# Patient Record
Sex: Female | Born: 1975 | Race: Black or African American | Hispanic: No | Marital: Single | Smoking: Never smoker
Health system: Southern US, Community
[De-identification: ages and names within clinical notes are randomized; demographics above are authoritative.]

## PROBLEM LIST (undated history)

## (undated) DIAGNOSIS — G35 Multiple sclerosis: Secondary | ICD-10-CM

## (undated) HISTORY — DX: Multiple sclerosis: G35

---
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
## 2001-02-17 ENCOUNTER — Other Ambulatory Visit: Admission: RE | Admit: 2001-02-17 | Discharge: 2001-02-17 | Payer: Self-pay | Admitting: *Deleted

## 2001-06-05 ENCOUNTER — Encounter (HOSPITAL_COMMUNITY): Admission: RE | Admit: 2001-06-05 | Discharge: 2001-07-05 | Payer: Self-pay | Admitting: Family Medicine

## 2002-01-20 ENCOUNTER — Emergency Department (HOSPITAL_COMMUNITY): Admission: EM | Admit: 2002-01-20 | Discharge: 2002-01-20 | Payer: Self-pay | Admitting: Emergency Medicine

## 2002-02-28 ENCOUNTER — Ambulatory Visit (HOSPITAL_COMMUNITY): Admission: RE | Admit: 2002-02-28 | Discharge: 2002-02-28 | Payer: Self-pay | Admitting: Psychiatry

## 2002-02-28 ENCOUNTER — Encounter: Payer: Self-pay | Admitting: Psychiatry

## 2002-04-06 ENCOUNTER — Emergency Department (HOSPITAL_COMMUNITY): Admission: EM | Admit: 2002-04-06 | Discharge: 2002-04-06 | Payer: Self-pay | Admitting: Internal Medicine

## 2002-04-07 ENCOUNTER — Encounter: Admission: RE | Admit: 2002-04-07 | Discharge: 2002-04-07 | Payer: Self-pay | Admitting: Family Medicine

## 2002-04-07 ENCOUNTER — Encounter (HOSPITAL_COMMUNITY): Admission: RE | Admit: 2002-04-07 | Discharge: 2002-05-07 | Payer: Self-pay | Admitting: Family Medicine

## 2003-01-29 ENCOUNTER — Encounter (HOSPITAL_COMMUNITY): Admission: RE | Admit: 2003-01-29 | Discharge: 2003-02-28 | Payer: Self-pay | Admitting: Family Medicine

## 2003-03-28 ENCOUNTER — Encounter: Payer: Self-pay | Admitting: Psychiatry

## 2003-03-28 ENCOUNTER — Ambulatory Visit (HOSPITAL_COMMUNITY): Admission: RE | Admit: 2003-03-28 | Discharge: 2003-03-28 | Payer: Self-pay | Admitting: Psychiatry

## 2009-01-28 DIAGNOSIS — R5383 Other fatigue: Secondary | ICD-10-CM | POA: Insufficient documentation

## 2012-06-09 DIAGNOSIS — L254 Unspecified contact dermatitis due to food in contact with skin: Secondary | ICD-10-CM | POA: Diagnosis not present

## 2013-01-17 ENCOUNTER — Encounter: Payer: Self-pay | Admitting: *Deleted

## 2013-02-13 DIAGNOSIS — H4720 Unspecified optic atrophy: Secondary | ICD-10-CM | POA: Diagnosis not present

## 2013-02-13 DIAGNOSIS — H52229 Regular astigmatism, unspecified eye: Secondary | ICD-10-CM | POA: Diagnosis not present

## 2013-02-13 DIAGNOSIS — G35 Multiple sclerosis: Secondary | ICD-10-CM | POA: Diagnosis not present

## 2013-02-13 DIAGNOSIS — H5231 Anisometropia: Secondary | ICD-10-CM | POA: Diagnosis not present

## 2013-07-19 ENCOUNTER — Ambulatory Visit (INDEPENDENT_AMBULATORY_CARE_PROVIDER_SITE_OTHER): Payer: Medicare Other | Admitting: Nurse Practitioner

## 2013-07-19 ENCOUNTER — Encounter: Payer: Self-pay | Admitting: Nurse Practitioner

## 2013-07-19 VITALS — BP 114/80 | Ht 67.0 in | Wt 167.1 lb

## 2013-07-19 DIAGNOSIS — M545 Low back pain: Secondary | ICD-10-CM

## 2013-07-19 DIAGNOSIS — G4700 Insomnia, unspecified: Secondary | ICD-10-CM | POA: Diagnosis not present

## 2013-07-19 DIAGNOSIS — G35 Multiple sclerosis: Secondary | ICD-10-CM

## 2013-07-19 DIAGNOSIS — K219 Gastro-esophageal reflux disease without esophagitis: Secondary | ICD-10-CM

## 2013-07-19 MED ORDER — ALPRAZOLAM 0.5 MG PO TABS: ORAL_TABLET | ORAL | Status: DC

## 2013-07-19 MED ORDER — NAPROXEN 500 MG PO TABS: 500.0000 mg | ORAL_TABLET | Freq: Two times a day (BID) | ORAL | Status: DC

## 2013-07-19 MED ORDER — HYDROCODONE-ACETAMINOPHEN 5-325 MG PO TABS: 1.0000 | ORAL_TABLET | Freq: Four times a day (QID) | ORAL | Status: DC | PRN

## 2013-07-19 MED ORDER — PANTOPRAZOLE SODIUM 40 MG PO TBEC: 40.0000 mg | DELAYED_RELEASE_TABLET | Freq: Every day | ORAL | Status: DC

## 2013-07-19 NOTE — Assessment & Plan Note (Signed)
Switched to [employer], insurance no longer covering Nexium. Symptoms are stable.

## 2013-07-19 NOTE — Patient Instructions (Signed)
If insurance will not cover TENS unit, recommend [facility]

## 2013-07-19 NOTE — Progress Notes (Signed)
Subjective:  Presents for followup. Is seeing a specialist at [facility] for her MS. Complaints of persistent low back pain for over a year after slipping in some water and landing on her low back area. Trouble sleeping due to pain. Tries to avoid taking regular pain medicine if possible. Can only sleep in certain positions with a pillow under her back. Worse with bending prolonged sitting or prolonged standing. Localized to the low back area. Slight relief with heat applications. Also slight relief with anti-inflammatory OTC meds.  Objective:   BP 114/80  Ht 5\' 7"  (1.702 m)  Wt 167 lb 2 oz (75.807 kg)  BMI 26.17 kg/m2 NAD. Alert, oriented. Lungs clear. Heart regular rhythm. Lumbar spinal tenderness noted with bilateral lumbar paraspinal tenderness. SLR negative bilaterally difficult exam due to MS. Abdomen soft nondistended nontender.  Assessment:Low back pain - Plan: DG Lumbar Spine Complete  GERD (gastroesophageal reflux disease)  Insomnia  Plan: Meds ordered this encounter  Medications  . ALPRAZolam (XANAX) 0.5 MG tablet    Sig: 1/2 - One BID  As needed    Dispense:  30 tablet    Refill:  0    Order Specific Question:  Supervising Provider    Answer:  Merlyn Albert [[id number]]  . HYDROcodone-acetaminophen (NORCO/VICODIN) 5-325 MG per tablet    Sig: Take 1 tablet by mouth every 6 (six) hours as needed for pain.    Dispense:  30 tablet    Refill:  2    Order Specific Question:  Supervising Provider    Answer:  Merlyn Albert [[id number]]  . naproxen (NAPROSYN) 500 MG tablet    Sig: Take 1 tablet (500 mg total) by mouth 2 (two) times daily with a meal. Prn pain    Dispense:  30 tablet    Refill:  2    Order Specific Question:  Supervising Provider    Answer:  Merlyn Albert [[id number]]   switch to Protonix 40 mg daily since Nexium is no longer covered by her insurance. Given prescription for TENS unit to see if her insurance will pay for this. Ice/heat to the back area with regular  stretching exercises. Recheck if persists. Otherwise routine followup.

## 2013-08-02 DIAGNOSIS — G35 Multiple sclerosis: Secondary | ICD-10-CM | POA: Diagnosis not present

## 2013-08-16 ENCOUNTER — Other Ambulatory Visit: Payer: Self-pay | Admitting: Nurse Practitioner

## 2013-08-16 ENCOUNTER — Other Ambulatory Visit: Payer: Self-pay | Admitting: *Deleted

## 2013-08-16 ENCOUNTER — Telehealth: Payer: Self-pay | Admitting: Nurse Practitioner

## 2013-08-16 MED ORDER — ALPRAZOLAM 0.5 MG PO TABS: ORAL_TABLET | ORAL | Status: DC

## 2013-08-16 MED ORDER — HYDROCODONE-ACETAMINOPHEN 5-325 MG PO TABS: 1.0000 | ORAL_TABLET | Freq: Four times a day (QID) | ORAL | Status: DC | PRN

## 2013-08-16 NOTE — Telephone Encounter (Signed)
Rx printed

## 2013-08-16 NOTE — Telephone Encounter (Addendum)
Patient notified and rx left up front for patient pick up

## 2013-08-16 NOTE — Telephone Encounter (Signed)
May have 3 refills 

## 2013-08-16 NOTE — Telephone Encounter (Signed)
Patient needs Rx for hydrocodone. °

## 2013-08-21 NOTE — Telephone Encounter (Signed)
Nurses please approve 4 refills

## 2013-09-24 ENCOUNTER — Other Ambulatory Visit: Payer: Self-pay | Admitting: Nurse Practitioner

## 2013-10-02 DIAGNOSIS — G35 Multiple sclerosis: Secondary | ICD-10-CM | POA: Diagnosis not present

## 2013-10-19 ENCOUNTER — Ambulatory Visit (INDEPENDENT_AMBULATORY_CARE_PROVIDER_SITE_OTHER): Payer: Medicare Other | Admitting: Nurse Practitioner

## 2013-10-19 ENCOUNTER — Encounter: Payer: Self-pay | Admitting: Nurse Practitioner

## 2013-10-19 VITALS — BP 118/78 | Ht 67.0 in | Wt 171.6 lb

## 2013-10-19 DIAGNOSIS — F411 Generalized anxiety disorder: Secondary | ICD-10-CM | POA: Diagnosis not present

## 2013-10-19 DIAGNOSIS — G8929 Other chronic pain: Secondary | ICD-10-CM | POA: Diagnosis not present

## 2013-10-19 DIAGNOSIS — K219 Gastro-esophageal reflux disease without esophagitis: Secondary | ICD-10-CM | POA: Diagnosis not present

## 2013-10-19 DIAGNOSIS — M549 Dorsalgia, unspecified: Secondary | ICD-10-CM | POA: Diagnosis not present

## 2013-10-19 DIAGNOSIS — Z23 Encounter for immunization: Secondary | ICD-10-CM | POA: Diagnosis not present

## 2013-10-19 MED ORDER — HYDROCODONE-ACETAMINOPHEN 5-325 MG PO TABS: ORAL_TABLET | ORAL | Status: DC

## 2013-10-19 MED ORDER — ALPRAZOLAM 0.5 MG PO TABS: ORAL_TABLET | ORAL | Status: DC

## 2013-10-19 MED ORDER — PANTOPRAZOLE SODIUM 40 MG PO TBEC: 40.0000 mg | DELAYED_RELEASE_TABLET | Freq: Every day | ORAL | Status: DC

## 2013-10-19 MED ORDER — CITALOPRAM HYDROBROMIDE 20 MG PO TABS: 20.0000 mg | ORAL_TABLET | Freq: Every day | ORAL | Status: DC

## 2013-10-19 MED ORDER — NAPROXEN 500 MG PO TABS: ORAL_TABLET | ORAL | Status: DC

## 2013-10-20 ENCOUNTER — Encounter: Payer: Self-pay | Admitting: Nurse Practitioner

## 2013-10-20 DIAGNOSIS — G8929 Other chronic pain: Secondary | ICD-10-CM | POA: Insufficient documentation

## 2013-10-20 DIAGNOSIS — F419 Anxiety disorder, unspecified: Secondary | ICD-10-CM | POA: Insufficient documentation

## 2013-10-20 NOTE — Assessment & Plan Note (Signed)
Continue hydrocodone and naproxen as directed when necessary.

## 2013-10-20 NOTE — Assessment & Plan Note (Signed)
Start Celexa as directed. Continue Xanax as directed. Recheck in one month.

## 2013-10-20 NOTE — Progress Notes (Signed)
Subjective:  Presents for routine followup. Continues to have chronic back pain. Getting significant relief with naproxen. Also using her TENS unit. Pain 7/10, hydrocodone totally relieves pain. Takes one pill a day, occasionally will take a second pill during the day when pain is intense. Reflux stable on Protonix. Has noticed more emotional lability and anxiety lately. Usually limits herself to one tablet of Xanax per day. Mainly for sleep at night. Denies suicidal thoughts or ideation.  Objective:   BP 118/78  Ht 5\' 7"  (1.702 m)  Wt 171 lb 9.6 oz (77.837 kg)  BMI 26.87 kg/m2 NAD. Alert, oriented. Cheerful affect. Lungs clear. Heart regular rate rhythm. Abdomen soft nontender.  Assessment:Chronic back pain  GERD (gastroesophageal reflux disease)  Anxiety state, unspecified  Plan: Meds ordered this encounter  Medications  . citalopram (CELEXA) 20 MG tablet    Sig: Take 1 tablet (20 mg total) by mouth daily. At bedtime    Dispense:  30 tablet    Refill:  0    Order Specific Question:  Supervising Provider    Answer:  Merlyn Albert [[id number]]  . ALPRAZolam (XANAX) 0.5 MG tablet    Sig: TAKE 1/2 TO 1 TABLET TWICE DAILY AS NEEDED    Dispense:  30 tablet    Refill:  2    Order Specific Question:  Supervising Provider    Answer:  Merlyn Albert [[id number]]  . DISCONTD: HYDROcodone-acetaminophen (NORCO/VICODIN) 5-325 MG per tablet    Sig: 1/2-1 po BID prn pain    Dispense:  45 tablet    Refill:  0    Order Specific Question:  Supervising Provider    Answer:  Merlyn Albert [[id number]]  . DISCONTD: HYDROcodone-acetaminophen (NORCO/VICODIN) 5-325 MG per tablet    Sig: 1/2-1 po BID prn pain    Dispense:  45 tablet    Refill:  0    May fill 30 days after 10/19/13    Order Specific Question:  Supervising Provider    Answer:  Merlyn Albert [[id number]]  . HYDROcodone-acetaminophen (NORCO/VICODIN) 5-325 MG per tablet    Sig: 1/2-1 po BID prn pain    Dispense:  45 tablet    Refill:  0   May fill 60 days after 10/19/13    Order Specific Question:  Supervising Provider    Answer:  Merlyn Albert [[id number]]  . naproxen (NAPROSYN) 500 MG tablet    Sig: TAKE 1 TABLET BY MOUTH TWO TIMES DAILY WITH A MEAL AS NEEDED FOR PAIN    Dispense:  60 tablet    Refill:  2    Order Specific Question:  Supervising Provider    Answer:  Merlyn Albert [[id number]]  . pantoprazole (PROTONIX) 40 MG tablet    Sig: Take 1 tablet (40 mg total) by mouth daily. For acid reflux    Dispense:  30 tablet    Refill:  5    Order Specific Question:  Supervising Provider    Answer:  Merlyn Albert [[id number]]   Continue ice/heat applications. TENS unit. Stretching exercises. Add Celexa to her regimen. Reviewed potential adverse effects. Stop medication and call if any problems. Recheck in one month, call back sooner if any problems. Reminded about preventive health physical.

## 2013-10-20 NOTE — Assessment & Plan Note (Signed)
Continue Protonix as directed.

## 2013-11-15 ENCOUNTER — Other Ambulatory Visit: Payer: Self-pay | Admitting: Nurse Practitioner

## 2013-11-20 ENCOUNTER — Encounter: Payer: Self-pay | Admitting: Nurse Practitioner

## 2013-11-20 ENCOUNTER — Ambulatory Visit (INDEPENDENT_AMBULATORY_CARE_PROVIDER_SITE_OTHER): Payer: Medicare Other | Admitting: Nurse Practitioner

## 2013-11-20 VITALS — BP 132/90 | Ht 67.5 in | Wt 171.8 lb

## 2013-11-20 DIAGNOSIS — F411 Generalized anxiety disorder: Secondary | ICD-10-CM | POA: Diagnosis not present

## 2013-11-20 MED ORDER — CITALOPRAM HYDROBROMIDE 20 MG PO TABS: ORAL_TABLET | ORAL | Status: DC

## 2013-11-24 ENCOUNTER — Encounter: Payer: Self-pay | Admitting: Nurse Practitioner

## 2013-11-24 NOTE — Assessment & Plan Note (Signed)
.   citalopram (CELEXA) 20 MG tablet    Sig: TAKE 1 TABLET AT BEDTIME    Dispense:  30 tablet    Refill:  5    Order Specific Question:  Supervising Provider    Answer:  Merlyn AlbertLUKING, WILLIAM S [[id number]]   Routine followup as planned. Call back if any problems.

## 2013-11-24 NOTE — Progress Notes (Signed)
Subjective:  Presents for routine followup. See previous note. Sleeping well. Emotional lability symptoms are much improved. Denies any adverse affects from Celexa.  Objective:   BP 132/90  Ht 5' 7.5" (1.715 m)  Wt 171 lb 12.8 oz (77.928 kg)  BMI 26.50 kg/m2 NAD. Alert, oriented. Cheerful affect. Lungs clear. Heart regular rate rhythm.  Assessment:Anxiety state, unspecified  status improved  Plan: Meds ordered this encounter  Medications  . citalopram (CELEXA) 20 MG tablet    Sig: TAKE 1 TABLET AT BEDTIME    Dispense:  30 tablet    Refill:  5    Order Specific Question:  Supervising Provider    Answer:  Merlyn Albert [[id number]]   Routine followup as planned. Call back if any problems.

## 2014-01-19 ENCOUNTER — Ambulatory Visit: Payer: Medicare Other | Admitting: Nurse Practitioner

## 2014-01-29 ENCOUNTER — Encounter: Payer: Self-pay | Admitting: Nurse Practitioner

## 2014-01-29 ENCOUNTER — Ambulatory Visit (INDEPENDENT_AMBULATORY_CARE_PROVIDER_SITE_OTHER): Payer: Medicare Other | Admitting: Nurse Practitioner

## 2014-01-29 VITALS — BP 120/80 | Ht 67.5 in | Wt 168.1 lb

## 2014-01-29 DIAGNOSIS — G8929 Other chronic pain: Secondary | ICD-10-CM | POA: Diagnosis not present

## 2014-01-29 DIAGNOSIS — F411 Generalized anxiety disorder: Secondary | ICD-10-CM | POA: Diagnosis not present

## 2014-01-29 DIAGNOSIS — M549 Dorsalgia, unspecified: Secondary | ICD-10-CM

## 2014-01-29 DIAGNOSIS — R350 Frequency of micturition: Secondary | ICD-10-CM

## 2014-01-29 DIAGNOSIS — K219 Gastro-esophageal reflux disease without esophagitis: Secondary | ICD-10-CM

## 2014-01-29 LAB — POCT URINALYSIS DIPSTICK
PH UA: 5
Spec Grav, UA: 1.02

## 2014-01-29 MED ORDER — ALPRAZOLAM 0.5 MG PO TABS: ORAL_TABLET | ORAL | Status: DC

## 2014-02-01 ENCOUNTER — Encounter: Payer: Self-pay | Admitting: Nurse Practitioner

## 2014-02-01 LAB — POCT UA - MICROSCOPIC ONLY
BACTERIA, U MICROSCOPIC: 0
MUCUS UA: 0
RBC, urine, microscopic: 0
WBC, UR, HPF, POC: 0

## 2014-02-01 NOTE — Progress Notes (Signed)
Subjective:  Presents for routine followup. Reflux stable on Protonix. Uses hydrocodone once a day for chronic back pain, occasionally will take a half to one tab during the day if needed. Doing well on Celexa, continues to have some breakthrough anxiety symptoms, using Xanax sparingly. Complaints of urinary frequency for the past several months. No dysuria. No pelvic pain. No vaginal discharge. Married, same sexual partner. No abdominal pain. No flank or mid back pain. Her chronic back pain is mainly in the low back area. No numbness or weakness of the legs. Bowels normal limit.  Objective:   BP 120/80  Ht 5' 7.5" (1.715 m)  Wt 168 lb 2 oz (76.261 kg)  BMI 25.93 kg/m2 NAD. Alert, oriented. Lungs clear. Heart regular rate rhythm. No CVA or flank tenderness. Mild tenderness in the lumbar paraspinal area. Reflexes normal limit lower extremities. Gait normal limit. Abdomen soft nondistended nontender. Results for orders placed in visit on 01/29/14  POCT URINALYSIS DIPSTICK      Result Value Ref Range   Color, UA       Clarity, UA       Glucose, UA       Bilirubin, UA       Ketones, UA       Spec Grav, UA 1.020     Blood, UA       pH, UA 5.0     Protein, UA       Urobilinogen, UA       Nitrite, UA       Leukocytes, UA small (1+)     urine microscopic negative.  Assessment: Problem List Items Addressed This Visit     Digestive   GERD (gastroesophageal reflux disease) (Chronic)     Other   Chronic back pain   Anxiety state, unspecified - Primary   Relevant Medications      ALPRAZolam (XANAX) tablet    Other Visit Diagnoses   Urinary frequency        Relevant Orders       POCT urinalysis dipstick (Completed)      Plan: Meds ordered this encounter  Medications  . ALPRAZolam (XANAX) 0.5 MG tablet    Sig: TAKE 1/2 TO 1 TABLET TWICE DAILY AS NEEDED    Dispense:  30 tablet    Refill:  2    Order Specific Question:  Supervising Provider    Answer:  Merlyn Albert [[id number]]   continue current medications as directed. Recommend ice/heat applications to the low back area. OTC TENS unit. Stretching exercises. Reminded about preventive health physical. Return in about 3 months (around 05/01/2014).

## 2014-02-05 ENCOUNTER — Telehealth: Payer: Self-pay | Admitting: Family Medicine

## 2014-02-05 MED ORDER — TRIAMCINOLONE ACETONIDE 0.1 % EX CREA: 1.0000 "application " | TOPICAL_CREAM | Freq: Two times a day (BID) | CUTANEOUS | Status: DC

## 2014-02-05 NOTE — Telephone Encounter (Signed)
Patient needs new prescription for itching triamcinolone acetonide cream called into [facility].

## 2014-02-05 NOTE — Addendum Note (Signed)
Addended byOneal Deputy D on: 02/05/2014 11:04 AM   Modules accepted: Orders

## 2014-02-05 NOTE — Telephone Encounter (Signed)
Medication sent patient notified. 

## 2014-02-05 NOTE — Telephone Encounter (Signed)
Ok 30 g .1% apply bid two ref

## 2014-02-16 ENCOUNTER — Encounter: Payer: Self-pay | Admitting: Nurse Practitioner

## 2014-02-16 ENCOUNTER — Ambulatory Visit (INDEPENDENT_AMBULATORY_CARE_PROVIDER_SITE_OTHER): Payer: Medicare Other | Admitting: Nurse Practitioner

## 2014-02-16 VITALS — BP 138/88 | Temp 98.4°F | Ht 67.5 in | Wt 167.0 lb

## 2014-02-16 DIAGNOSIS — B354 Tinea corporis: Secondary | ICD-10-CM | POA: Diagnosis not present

## 2014-02-16 MED ORDER — KETOCONAZOLE 2 % EX CREA: 1.0000 "application " | TOPICAL_CREAM | Freq: Two times a day (BID) | CUTANEOUS | Status: DC

## 2014-02-19 ENCOUNTER — Encounter: Payer: Self-pay | Admitting: Nurse Practitioner

## 2014-02-19 NOTE — Progress Notes (Signed)
Subjective:  Presents complaints of a rash on her lower abdomen mainly on the right side that is been there for a while. Used prescribed steroid cream with only slight improvement. Minimal itching, mainly a stinging sensation. No fever. No other rash. Blood sugars have been normal in the past, has not been checked lately.  Objective:   BP 138/88  Temp(Src) 98.4 F (36.9 C) (Oral)  Ht 5' 7.5" (1.715 m)  Wt 167 lb (75.751 kg)  BMI 25.75 kg/m2 Large oval-shaped moderately erythematous area with central clearing and well-defined border noted in the lower skin folds of the abdomen on the right side. No other rash noted.  Assessment:Tinea corporis   Plan: Meds ordered this encounter  Medications  . ketoconazole (NIZORAL) 2 % cream    Sig: Apply 1 application topically 2 (two) times daily.    Dispense:  30 g    Refill:  0    Order Specific Question:  Supervising Provider    Answer:  Merlyn AlbertLUKING, WILLIAM S [[id number]]   Use twice a day for 2 weeks. Call back if rash persists. Keep area clean and dry as possible. Strongly recommend preventive health physical.

## 2014-03-15 ENCOUNTER — Other Ambulatory Visit: Payer: Self-pay | Admitting: Nurse Practitioner

## 2014-05-01 ENCOUNTER — Ambulatory Visit: Payer: Medicare Other | Admitting: Nurse Practitioner

## 2014-06-08 ENCOUNTER — Encounter: Payer: Self-pay | Admitting: Nurse Practitioner

## 2014-06-08 ENCOUNTER — Ambulatory Visit (INDEPENDENT_AMBULATORY_CARE_PROVIDER_SITE_OTHER): Payer: Medicare Other | Admitting: Nurse Practitioner

## 2014-06-08 VITALS — BP 124/80 | Temp 98.8°F | Ht 67.5 in | Wt 170.0 lb

## 2014-06-08 DIAGNOSIS — E28319 Asymptomatic premature menopause: Secondary | ICD-10-CM

## 2014-06-08 DIAGNOSIS — N912 Amenorrhea, unspecified: Secondary | ICD-10-CM

## 2014-06-09 LAB — LUTEINIZING HORMONE: LH: 56.8 m[IU]/mL

## 2014-06-09 LAB — PROGESTERONE: Progesterone: 0.3 ng/mL

## 2014-06-09 LAB — FOLLICLE STIMULATING HORMONE: FSH: 101.6 m[IU]/mL

## 2014-06-12 ENCOUNTER — Other Ambulatory Visit: Payer: Self-pay | Admitting: Nurse Practitioner

## 2014-06-12 MED ORDER — ESTRADIOL 2 MG PO TABS: 2.0000 mg | ORAL_TABLET | Freq: Every day | ORAL | Status: DC

## 2014-06-12 MED ORDER — MEDROXYPROGESTERONE ACETATE 5 MG PO TABS: 5.0000 mg | ORAL_TABLET | Freq: Every day | ORAL | Status: DC

## 2014-06-15 ENCOUNTER — Encounter: Payer: Self-pay | Admitting: Nurse Practitioner

## 2014-06-15 DIAGNOSIS — E28319 Asymptomatic premature menopause: Secondary | ICD-10-CM | POA: Insufficient documentation

## 2014-06-15 DIAGNOSIS — Z79890 Hormone replacement therapy (postmenopausal): Secondary | ICD-10-CM

## 2014-06-15 LAB — ESTRADIOL, FREE
ESTRADIOL: 5 pg/mL
Estradiol, Free: 0.1 pg/mL

## 2014-06-15 NOTE — Progress Notes (Signed)
Subjective:  Presents to discuss her female hormones. Patient is thinking about a possible pregnancy. Has had an OB workup in [place]. Our last labs here a few years ago indicate premature menopause. Even if pregnancy is not a possibility especially due to cost, patient is interested in hormone therapy due to fatigue and decreased libido. Nonsmoker. No personal history of blood clots.  Objective:   BP 124/80  Temp(Src) 98.8 F (37.1 C) (Oral)  Ht 5' 7.5" (1.715 m)  Wt 170 lb (77.111 kg)  BMI 26.22 kg/m2 NAD. Alert, oriented. Lungs clear. Heart regular rate rhythm. Labs dated 01/16/2011 shows FSH 110.8 and LH 59.9.  Assessment: Anovulatory amenorrhea - Plan: Follicle stimulating hormone, Luteinizing hormone, Progesterone, Estradiol  Premature menopause  Plan: Lab work pending. Discussed starting HRT. Reviewed risks associated with HRT use. Defers referral for fertility testing or further workup. Explained that pregnancy is unlikely to happen if her numbers are still this high.

## 2014-07-27 ENCOUNTER — Telehealth: Payer: Self-pay | Admitting: Nurse Practitioner

## 2014-07-27 NOTE — Telephone Encounter (Signed)
Pt is having intermittent periods, was under the understanding that  She would not have any more since she was going through menopause  At this point. Wants to know if this is normal to have one once in a while Till they just stop all together?    She started the day after her bday, ran for four days. Then started again this week Ran for a few days    [facility]

## 2014-08-01 NOTE — Telephone Encounter (Signed)
Was this patient ever contacted?  °

## 2014-08-10 NOTE — Telephone Encounter (Signed)
I reviewed notes. Bleeding should have stopped based on labs. I recommend pelvic US for Abnormal Uterine Bleeding with follow up based on results. This will look at ovaries and uterus (fibroids and thickness of lining).

## 2014-08-13 NOTE — Telephone Encounter (Signed)
Left message to return call 

## 2014-08-13 NOTE — Telephone Encounter (Signed)
Patient said she had her last cycle years ago had off and on bleeding in Sept but no more problems in a week.

## 2014-08-22 NOTE — Telephone Encounter (Signed)
Still recommend pelvic US

## 2014-08-22 NOTE — Telephone Encounter (Signed)
Discussed with patient that carolyn recommends pelvic ultrasound.  Pt states she will call us back with the days that are good for her to go.

## 2014-09-21 ENCOUNTER — Telehealth: Payer: Self-pay | Admitting: Nurse Practitioner

## 2014-09-21 ENCOUNTER — Other Ambulatory Visit: Payer: Self-pay | Admitting: Nurse Practitioner

## 2014-09-21 MED ORDER — FLUOXETINE HCL 40 MG PO CAPS: 40.0000 mg | ORAL_CAPSULE | Freq: Every day | ORAL | Status: DC

## 2014-09-21 NOTE — Telephone Encounter (Signed)
Please clarify Celexa or Prozac?

## 2014-09-21 NOTE — Telephone Encounter (Signed)
Patient did not like the Celexa and wants to go back to Prozac

## 2014-09-21 NOTE — Telephone Encounter (Signed)
Patient needs Rx [facility]zac to [facility].

## 2014-09-22 DIAGNOSIS — Z23 Encounter for immunization: Secondary | ICD-10-CM | POA: Diagnosis not present

## 2015-04-11 ENCOUNTER — Encounter: Payer: Medicare Other | Admitting: Nurse Practitioner

## 2015-04-11 ENCOUNTER — Other Ambulatory Visit: Payer: Self-pay | Admitting: Nurse Practitioner

## 2015-05-07 ENCOUNTER — Ambulatory Visit (INDEPENDENT_AMBULATORY_CARE_PROVIDER_SITE_OTHER): Payer: Medicare Other | Admitting: Nurse Practitioner

## 2015-05-07 ENCOUNTER — Encounter: Payer: Self-pay | Admitting: Nurse Practitioner

## 2015-05-07 VITALS — BP 120/82

## 2015-05-07 DIAGNOSIS — M549 Dorsalgia, unspecified: Secondary | ICD-10-CM

## 2015-05-07 DIAGNOSIS — Z79899 Other long term (current) drug therapy: Secondary | ICD-10-CM

## 2015-05-07 DIAGNOSIS — E28319 Asymptomatic premature menopause: Secondary | ICD-10-CM | POA: Diagnosis not present

## 2015-05-07 DIAGNOSIS — G8929 Other chronic pain: Secondary | ICD-10-CM

## 2015-05-07 DIAGNOSIS — Z79890 Hormone replacement therapy (postmenopausal): Secondary | ICD-10-CM

## 2015-05-07 DIAGNOSIS — Z01419 Encounter for gynecological examination (general) (routine) without abnormal findings: Secondary | ICD-10-CM

## 2015-05-07 DIAGNOSIS — R5383 Other fatigue: Secondary | ICD-10-CM | POA: Diagnosis not present

## 2015-05-07 DIAGNOSIS — Z124 Encounter for screening for malignant neoplasm of cervix: Secondary | ICD-10-CM

## 2015-05-07 DIAGNOSIS — Z0000 Encounter for general adult medical examination without abnormal findings: Secondary | ICD-10-CM

## 2015-05-07 DIAGNOSIS — F419 Anxiety disorder, unspecified: Secondary | ICD-10-CM

## 2015-05-07 MED ORDER — TRAZODONE HCL 100 MG PO TABS: 100.0000 mg | ORAL_TABLET | Freq: Every day | ORAL | Status: DC

## 2015-05-07 MED ORDER — FLUOXETINE HCL 40 MG PO CAPS: 40.0000 mg | ORAL_CAPSULE | Freq: Every day | ORAL | Status: DC

## 2015-05-07 MED ORDER — HYDROCODONE-ACETAMINOPHEN 5-325 MG PO TABS: 1.0000 | ORAL_TABLET | ORAL | Status: DC | PRN

## 2015-05-07 NOTE — Patient Instructions (Signed)
Tdap or DT

## 2015-05-08 LAB — CBC WITH DIFFERENTIAL/PLATELET
BASOS: 1 %
Basophils Absolute: 0 10*3/uL (ref 0.0–0.2)
EOS (ABSOLUTE): 0.1 10*3/uL (ref 0.0–0.4)
Eos: 3 %
HEMATOCRIT: 34.6 % (ref 34.0–46.6)
Hemoglobin: 11 g/dL — ABNORMAL LOW (ref 11.1–15.9)
Immature Grans (Abs): 0 10*3/uL (ref 0.0–0.1)
Immature Granulocytes: 0 %
LYMPHS: 54 %
Lymphocytes Absolute: 2.3 10*3/uL (ref 0.7–3.1)
MCH: 26.6 pg (ref 26.6–33.0)
MCHC: 31.8 g/dL (ref 31.5–35.7)
MCV: 84 fL (ref 79–97)
Monocytes Absolute: 0.2 10*3/uL (ref 0.1–0.9)
Monocytes: 5 %
NEUTROS ABS: 1.6 10*3/uL (ref 1.4–7.0)
Neutrophils: 37 %
Platelets: 363 10*3/uL (ref 150–379)
RBC: 4.13 x10E6/uL (ref 3.77–5.28)
RDW: 13.9 % (ref 12.3–15.4)
WBC: 4.3 10*3/uL (ref 3.4–10.8)

## 2015-05-08 LAB — COMPREHENSIVE METABOLIC PANEL
ALK PHOS: 71 IU/L (ref 39–117)
ALT: 11 IU/L (ref 0–32)
AST: 14 IU/L (ref 0–40)
Albumin/Globulin Ratio: 1.3 (ref 1.1–2.5)
Albumin: 4.3 g/dL (ref 3.5–5.5)
BILIRUBIN TOTAL: 0.3 mg/dL (ref 0.0–1.2)
BUN / CREAT RATIO: 8 (ref 8–20)
BUN: 7 mg/dL (ref 6–20)
CO2: 22 mmol/L (ref 18–29)
Calcium: 9.7 mg/dL (ref 8.7–10.2)
Chloride: 103 mmol/L (ref 97–108)
Creatinine, Ser: 0.85 mg/dL (ref 0.57–1.00)
GFR calc non Af Amer: 87 mL/min/{1.73_m2} (ref >59)
GFR, EST AFRICAN AMERICAN: 100 mL/min/{1.73_m2} (ref >59)
Globulin, Total: 3.2 g/dL (ref 1.5–4.5)
Glucose: 99 mg/dL (ref 65–99)
POTASSIUM: 4.1 mmol/L (ref 3.5–5.2)
Sodium: 140 mmol/L (ref 134–144)
Total Protein: 7.5 g/dL (ref 6.0–8.5)

## 2015-05-08 LAB — LIPID PANEL
Chol/HDL Ratio: 4.3 ratio units (ref 0.0–4.4)
Cholesterol, Total: 204 mg/dL — ABNORMAL HIGH (ref 100–199)
HDL: 47 mg/dL (ref >39)
LDL Calculated: 136 mg/dL — ABNORMAL HIGH (ref 0–99)
TRIGLYCERIDES: 106 mg/dL (ref 0–149)
VLDL Cholesterol Cal: 21 mg/dL (ref 5–40)

## 2015-05-08 LAB — TSH: TSH: 3.18 u[IU]/mL (ref 0.450–4.500)

## 2015-05-08 LAB — HEPATIC FUNCTION PANEL: Bilirubin, Direct: 0.07 mg/dL (ref 0.00–0.40)

## 2015-05-10 ENCOUNTER — Encounter: Payer: Self-pay | Admitting: Nurse Practitioner

## 2015-05-10 LAB — PAP IG W/ RFLX HPV ASCU: PAP Smear Comment: 0

## 2015-05-10 NOTE — Progress Notes (Signed)
Subjective:    Patient ID: Stacey Kim, female    DOB: 1975/12/02, 39 y.o.   MRN: [id number]  HPI presents for her wellness exam. Same sexual partner. No vaginal bleeding or pelvic pain. Regular vision and dental exams. Anxiety and depression stable on current regimen. Regular exercise. Down a size in clothing. Overall healthy diet. Continues follow up with MS specialist. Uses rare hydrocodone for chronic back pain.     Review of Systems  Constitutional: Positive for fatigue. Negative for fever, activity change and appetite change.  HENT: Negative for dental problem, ear pain, sinus pressure and sore throat.   Respiratory: Negative for cough, chest tightness, shortness of breath and wheezing.   Cardiovascular: Negative for chest pain.  Gastrointestinal: Negative for nausea, vomiting, abdominal pain, diarrhea, constipation and abdominal distention.  Genitourinary: Negative for dysuria, urgency, frequency, vaginal bleeding, vaginal discharge, enuresis, difficulty urinating, genital sores and pelvic pain.  Musculoskeletal: Positive for back pain.       Objective:   Physical Exam  Constitutional: She is oriented to person, place, and time. She appears well-developed. No distress.  HENT:  Right Ear: External ear normal.  Left Ear: External ear normal.  Mouth/Throat: Oropharynx is clear and moist.  Neck: Normal range of motion. Neck supple. No tracheal deviation present. No thyromegaly present.  Cardiovascular: Normal rate, regular rhythm and normal heart sounds.  Exam reveals no gallop.   No murmur heard. Pulmonary/Chest: Effort normal and breath sounds normal.  Abdominal: Soft. She exhibits no distension. There is no tenderness.  Genitourinary: Vagina normal and uterus normal. No vaginal discharge found.  External GU: no rashes or lesions. Vagina: no discharge. Cervix normal in appearance; no CMT. Bimanual exam: no tenderness or obvious masses.  Musculoskeletal: She exhibits no  edema.  Lymphadenopathy:    She has no cervical adenopathy.  Neurological: She is alert and oriented to person, place, and time.  Skin: Skin is warm and dry. No rash noted.  Psychiatric: She has a normal mood and affect. Her behavior is normal.  Vitals reviewed. Breast exam: no masses; axillae no adenopathy.         Assessment & Plan:   Problem List Items Addressed This Visit      Other   Anxiety   Relevant Medications   FLUoxetine (PROZAC) 40 MG capsule   traZODone (DESYREL) 100 MG tablet   Chronic back pain   Relevant Medications   HYDROcodone-acetaminophen (NORCO/VICODIN) 5-325 MG per tablet   Premature menopause on hormone replacement therapy    Other Visit Diagnoses    Well woman exam    -  Primary    Relevant Orders    Pap IG w/ reflex to HPV when ASC-U    Lipid panel (Completed)    Screening for cervical cancer        Relevant Orders    Pap IG w/ reflex to HPV when ASC-U    High risk medication use        Relevant Orders    Hepatic function panel (Completed)    Comprehensive metabolic panel (Completed)    Other fatigue        Relevant Orders    CBC w/Diff (Completed)    TSH (Completed)      Meds ordered this encounter  Medications  . DISCONTD: traZODone (DESYREL) 100 MG tablet    Sig: Take 100 mg by mouth at bedtime.  Marland Kitchen DISCONTD: HYDROcodone-acetaminophen (VICODIN) 5-500 MG per tablet    Sig: Take 1 tablet by  mouth every 6 (six) hours as needed for pain.  Marland Kitchen FLUoxetine (PROZAC) 40 MG capsule    Sig: Take 1 capsule (40 mg total) by mouth daily.    Dispense:  30 capsule    Refill:  5    Order Specific Question:  Supervising Provider    Answer:  Merlyn Albert [[id number]]  . traZODone (DESYREL) 100 MG tablet    Sig: Take 1 tablet (100 mg total) by mouth at bedtime.    Dispense:  30 tablet    Refill:  5    Order Specific Question:  Supervising Provider    Answer:  Merlyn Albert [[id number]]  . HYDROcodone-acetaminophen (NORCO/VICODIN) 5-325 MG per tablet      Sig: Take 1 tablet by mouth every 4 (four) hours as needed.    Dispense:  30 tablet    Refill:  0    Order Specific Question:  Supervising Provider    Answer:  Merlyn Albert [[id number]]   Encouraged continued activity and weight loss. Daily vitamin D and calcium.  Return in about 6 months (around 11/06/2015) for recheck.

## 2015-07-04 ENCOUNTER — Other Ambulatory Visit: Payer: Self-pay | Admitting: Nurse Practitioner

## 2015-07-04 MED ORDER — HYDROCODONE-ACETAMINOPHEN 5-325 MG PO TABS: 1.0000 | ORAL_TABLET | ORAL | Status: DC | PRN

## 2015-07-04 MED ORDER — TRIAMCINOLONE ACETONIDE 0.1 % EX CREA: 1.0000 "application " | TOPICAL_CREAM | Freq: Two times a day (BID) | CUTANEOUS | Status: DC

## 2015-07-26 ENCOUNTER — Other Ambulatory Visit: Payer: Self-pay | Admitting: Nurse Practitioner

## 2015-08-29 DIAGNOSIS — Z23 Encounter for immunization: Secondary | ICD-10-CM | POA: Diagnosis not present

## 2015-09-30 ENCOUNTER — Telehealth: Payer: Self-pay | Admitting: Family Medicine

## 2015-09-30 MED ORDER — HYDROCODONE-ACETAMINOPHEN 5-325 MG PO TABS: 1.0000 | ORAL_TABLET | ORAL | Status: DC | PRN

## 2015-09-30 NOTE — Telephone Encounter (Signed)
She may have a prescription for 15 tablets but will need follow-up office visit winds she is needing additional prescription-explain to patient that this is a class II medicine she may not be aware of this

## 2015-09-30 NOTE — Telephone Encounter (Signed)
Rx up front for patient pick up. Patient notified and will schedule follow up office visit for further refills.

## 2015-09-30 NOTE — Telephone Encounter (Signed)
HYDROcodone-acetaminophen (NORCO/VICODIN) 5-325 MG per tablet  Pt needs refill please

## 2015-10-01 ENCOUNTER — Telehealth: Payer: Self-pay | Admitting: Nurse Practitioner

## 2015-10-01 NOTE — Telephone Encounter (Signed)
Left message to return call 

## 2015-10-01 NOTE — Telephone Encounter (Signed)
Appointment scheduled 10/21/15 with Eber Jones for pain management.

## 2015-10-01 NOTE — Telephone Encounter (Signed)
Pt is wanting Eber Jones to call her. Pt did not specify what the reason was.

## 2015-10-01 NOTE — Telephone Encounter (Signed)
Patient would like to schedule a follow up on pain management with Eber Jones. Patient transferred up front to schedule office visit.

## 2015-10-21 ENCOUNTER — Ambulatory Visit (INDEPENDENT_AMBULATORY_CARE_PROVIDER_SITE_OTHER): Payer: Medicare Other | Admitting: Nurse Practitioner

## 2015-10-21 ENCOUNTER — Other Ambulatory Visit: Payer: Self-pay | Admitting: Nurse Practitioner

## 2015-10-21 ENCOUNTER — Encounter: Payer: Self-pay | Admitting: Nurse Practitioner

## 2015-10-21 ENCOUNTER — Telehealth: Payer: Self-pay | Admitting: Nurse Practitioner

## 2015-10-21 VITALS — BP 114/76 | Ht 66.0 in | Wt 187.0 lb

## 2015-10-21 DIAGNOSIS — F419 Anxiety disorder, unspecified: Secondary | ICD-10-CM | POA: Diagnosis not present

## 2015-10-21 DIAGNOSIS — E28319 Asymptomatic premature menopause: Secondary | ICD-10-CM

## 2015-10-21 DIAGNOSIS — G8929 Other chronic pain: Secondary | ICD-10-CM

## 2015-10-21 DIAGNOSIS — Z79890 Hormone replacement therapy (postmenopausal): Secondary | ICD-10-CM

## 2015-10-21 DIAGNOSIS — K219 Gastro-esophageal reflux disease without esophagitis: Secondary | ICD-10-CM

## 2015-10-21 DIAGNOSIS — M549 Dorsalgia, unspecified: Secondary | ICD-10-CM | POA: Diagnosis not present

## 2015-10-21 MED ORDER — HYDROCODONE-ACETAMINOPHEN 5-325 MG PO TABS: 1.0000 | ORAL_TABLET | ORAL | Status: DC | PRN

## 2015-10-21 MED ORDER — PANTOPRAZOLE SODIUM 40 MG PO TBEC: 40.0000 mg | DELAYED_RELEASE_TABLET | Freq: Every day | ORAL | Status: DC

## 2015-10-21 MED ORDER — FLUOXETINE HCL 40 MG PO CAPS: 40.0000 mg | ORAL_CAPSULE | Freq: Every day | ORAL | Status: DC

## 2015-10-21 MED ORDER — NORETHINDRONE ACET-ETHINYL EST 1-20 MG-MCG PO TABS: 1.0000 | ORAL_TABLET | Freq: Every day | ORAL | Status: DC

## 2015-10-21 MED ORDER — NORETHIN-ETH ESTRAD-FE BIPHAS 1 MG-10 MCG / 10 MCG PO TABS: 1.0000 | ORAL_TABLET | Freq: Every day | ORAL | Status: DC

## 2015-10-21 MED ORDER — KETOCONAZOLE 2 % EX CREA: TOPICAL_CREAM | CUTANEOUS | Status: DC

## 2015-10-21 MED ORDER — NAPROXEN 500 MG PO TABS: ORAL_TABLET | ORAL | Status: DC

## 2015-10-21 NOTE — Telephone Encounter (Signed)
[facility] 10/21/15 

## 2015-10-21 NOTE — Telephone Encounter (Signed)
Will try another one. Let me know if too expensive.

## 2015-10-21 NOTE — Telephone Encounter (Signed)
Pt is unable to afford the medication that was called in. The medication will cost $120 and her ins doesn't cover it. Pt is needing something called in that her ins will cover or is cheaper. Please advise.

## 2015-10-21 NOTE — Telephone Encounter (Signed)
Called patient and informed her per Stacey Kim- low dose pill was sent in and pharmacy was asked not to fill it until she could see how much it will cost. Also wants patient to take it continuously, so we will probably need to get PA which will take a little time. Patient verbalized understanding.

## 2015-10-21 NOTE — Telephone Encounter (Signed)
Patient was seen this am. I sent in order for her low dose pill and asked them not to fill it until she could see how much it will cost. Also want her to take it continuously, so we will probably need to get PA which will take a little time.

## 2015-10-21 NOTE — Telephone Encounter (Signed)
Called patient and informed her per Nathaneil Canary- Will try another one. Let me know if too expensive. Patient verbalized understanding.

## 2015-10-22 ENCOUNTER — Other Ambulatory Visit: Payer: Self-pay | Admitting: *Deleted

## 2015-10-22 MED ORDER — FLUOXETINE HCL 40 MG PO CAPS: 40.0000 mg | ORAL_CAPSULE | Freq: Every day | ORAL | Status: DC

## 2015-10-23 ENCOUNTER — Encounter: Payer: Self-pay | Admitting: Nurse Practitioner

## 2015-10-23 MED ORDER — TRAZODONE HCL 100 MG PO TABS: 100.0000 mg | ORAL_TABLET | Freq: Every day | ORAL | Status: AC

## 2015-10-23 NOTE — Progress Notes (Signed)
Subjective:  Presents for routine follow-up. Continues to have amenorrhea. Significantly decreased sex to. Continues to have hot flashes. Stopped her hormone therapy. Increased generalized pain particularly chronic back pain during the wintertime. Increases her pain medication dose during that time. Uses on a when necessary basis. Reflux has been stable, takes Protonix as needed. Also takes naproxen with food as needed for pain which helps.  Objective:   BP 114/76 mmHg  Ht 5\' 6"  (1.676 m)  Wt 187 lb (84.823 kg)  BMI 30.20 kg/m2 NAD. Alert, oriented. Cheerful affect. Lungs clear. Heart regular rate rhythm. Abdomen soft nondistended nontender.  Assessment:  Problem List Items Addressed This Visit      Digestive   GERD (gastroesophageal reflux disease) - Primary (Chronic)   Relevant Medications   pantoprazole (PROTONIX) 40 MG tablet     Other   Anxiety   Chronic back pain   Relevant Medications   naproxen (NAPROSYN) 500 MG tablet   HYDROcodone-acetaminophen (NORCO/VICODIN) 5-325 MG tablet   Premature menopause on hormone replacement therapy     Plan:  Meds ordered this encounter  Medications  . DISCONTD: FLUoxetine (PROZAC) 40 MG capsule    Sig: Take 1 capsule (40 mg total) by mouth daily.    Dispense:  30 capsule    Refill:  5    Order Specific Question:  Supervising Provider    Answer:  Merlyn Albert [[id number]]  . DISCONTD: HYDROcodone-acetaminophen (NORCO/VICODIN) 5-325 MG tablet    Sig: Take 1 tablet by mouth every 4 (four) hours as needed.    Dispense:  30 tablet    Refill:  0    Order Specific Question:  Supervising Provider    Answer:  Merlyn Albert [[id number]]  . naproxen (NAPROSYN) 500 MG tablet    Sig: TAKE 1 TABLET TWICE A DAY WITH FOOD AS NEEDED FOR PAIN    Dispense:  60 tablet    Refill:  2    Order Specific Question:  Supervising Provider    Answer:  Merlyn Albert [[id number]]  . pantoprazole (PROTONIX) 40 MG tablet    Sig: Take 1 tablet (40 mg total) by mouth  daily. For acid reflux    Dispense:  30 tablet    Refill:  5    Order Specific Question:  Supervising Provider    Answer:  Merlyn Albert [[id number]]  . ketoconazole (NIZORAL) 2 % cream    Sig: APPLY TOPICALLY TWO TIMES DAILY PRN    Dispense:  30 g    Refill:  0    Order Specific Question:  Supervising Provider    Answer:  Merlyn Albert [[id number]]  . DISCONTD: HYDROcodone-acetaminophen (NORCO/VICODIN) 5-325 MG tablet    Sig: Take 1 tablet by mouth every 4 (four) hours as needed.    Dispense:  30 tablet    Refill:  0    May fill 30 days from 10/21/15    Order Specific Question:  Supervising Provider    Answer:  Merlyn Albert [[id number]]  . HYDROcodone-acetaminophen (NORCO/VICODIN) 5-325 MG tablet    Sig: Take 1 tablet by mouth every 4 (four) hours as needed.    Dispense:  30 tablet    Refill:  0    May fill 60 days from 10/21/15    Order Specific Question:  Supervising Provider    Answer:  Merlyn Albert [[id number]]  . DISCONTD: Norethindrone-Ethinyl Estradiol-Fe Biphas (LO LOESTRIN FE) 1 MG-10 MCG / 10 MCG tablet    Sig: Take 1  tablet by mouth daily.    Dispense:  1 Package    Refill:  11    Please do not fill until patient knows cost of med; also needs to take continuously so will probably need PA    Order Specific Question:  Supervising Provider    Answer:  Merlyn Albert [[id number]]   limit hydrocodone to 30 pills per month. Discussed options regarding hormones. Reviewed risk including blood clots and cancer risk. Start low-dose birth control pills continuously. Call back if any problems. Recommend regular activity as tolerated discussed importance of weight loss. Return in about 6 months (around 04/20/2016) for physical.

## 2015-12-11 ENCOUNTER — Ambulatory Visit (INDEPENDENT_AMBULATORY_CARE_PROVIDER_SITE_OTHER): Payer: Medicare Other | Admitting: Nurse Practitioner

## 2015-12-11 ENCOUNTER — Encounter: Payer: Self-pay | Admitting: Nurse Practitioner

## 2015-12-11 VITALS — BP 132/90 | Ht 66.0 in | Wt 188.0 lb

## 2015-12-11 DIAGNOSIS — L03115 Cellulitis of right lower limb: Secondary | ICD-10-CM

## 2015-12-11 DIAGNOSIS — N939 Abnormal uterine and vaginal bleeding, unspecified: Secondary | ICD-10-CM

## 2015-12-11 MED ORDER — ALPRAZOLAM 0.5 MG PO TBDP: 0.5000 mg | ORAL_TABLET | Freq: Two times a day (BID) | ORAL | Status: DC | PRN

## 2015-12-11 MED ORDER — DOXYCYCLINE HYCLATE 100 MG PO TABS: 100.0000 mg | ORAL_TABLET | Freq: Two times a day (BID) | ORAL | Status: DC

## 2015-12-11 MED ORDER — ALPRAZOLAM 0.5 MG PO TABS: 0.5000 mg | ORAL_TABLET | Freq: Two times a day (BID) | ORAL | Status: DC | PRN

## 2015-12-12 ENCOUNTER — Encounter: Payer: Self-pay | Admitting: Nurse Practitioner

## 2015-12-12 DIAGNOSIS — N939 Abnormal uterine and vaginal bleeding, unspecified: Secondary | ICD-10-CM | POA: Insufficient documentation

## 2015-12-12 NOTE — Progress Notes (Signed)
Subjective:   Presents to discuss her menstrual cycle. On  1/23 had her first cycle she's had a long time. Lasted about a week. Dark blood noted. Currently on daily birth control pills. Cycle occurred on time during inactive pills. No pelvic pain. Married, same sexual partner. Also has a sore area on the right outer leg for the past 2 weeks. Much improved. Husband has a similar area. Also requesting some Xanax to help her with claustrophobia for an upcoming MRI.  Objective:   BP 132/90 mmHg  Ht 5\' 6"  (1.676 m)  Wt 188 lb (85.276 kg)  BMI 30.36 kg/m2  NAD. Alert, oriented. Lungs clear. Heart regular rate rhythm. Approximately 1 cm slightly raised discolored area on the right outer leg slightly firm minimally tender.  Assessment:  Problem List Items Addressed This Visit      Genitourinary   Abnormal uterine bleeding - Primary   Relevant Orders   US Pelvis Complete   US Transvaginal Non-OB    Other Visit Diagnoses    Cellulitis of leg, right     resolving      Plan:  Meds ordered this encounter  Medications  . DISCONTD: ALPRAZolam (NIRAVAM) 0.5 MG dissolvable tablet    Sig: Take 1 tablet (0.5 mg total) by mouth 2 (two) times daily as needed for anxiety.    Dispense:  10 tablet    Refill:  0    Order Specific Question:  Supervising Provider    Answer:  Merlyn Albert [[id number]]  . doxycycline (VIBRA-TABS) 100 MG tablet    Sig: Take 1 tablet (100 mg total) by mouth 2 (two) times daily.    Dispense:  20 tablet    Refill:  0    Order Specific Question:  Supervising Provider    Answer:  Merlyn Albert [[id number]]  . ALPRAZolam (XANAX) 0.5 MG tablet    Sig: Take 1 tablet (0.5 mg total) by mouth 2 (two) times daily as needed for anxiety.    Dispense:  10 tablet    Refill:  0    Order Specific Question:  Supervising Provider    Answer:  Merlyn Albert [[id number]]    continue birth control pills as directed. Call back if any problems.

## 2015-12-20 ENCOUNTER — Ambulatory Visit (HOSPITAL_COMMUNITY): Payer: Medicare Other

## 2015-12-20 ENCOUNTER — Ambulatory Visit (HOSPITAL_COMMUNITY)
Admission: RE | Admit: 2015-12-20 | Discharge: 2015-12-20 | Disposition: A | Discharge status: Home / Self Care | Payer: Medicare Other | Source: Ambulatory Visit | Attending: Nurse Practitioner | Admitting: Nurse Practitioner

## 2015-12-20 DIAGNOSIS — N939 Abnormal uterine and vaginal bleeding, unspecified: Secondary | ICD-10-CM | POA: Diagnosis not present

## 2015-12-20 DIAGNOSIS — N938 Other specified abnormal uterine and vaginal bleeding: Secondary | ICD-10-CM | POA: Diagnosis not present

## 2016-08-21 ENCOUNTER — Ambulatory Visit (INDEPENDENT_AMBULATORY_CARE_PROVIDER_SITE_OTHER): Payer: Medicare Other | Admitting: Nurse Practitioner

## 2016-08-21 ENCOUNTER — Encounter: Payer: Self-pay | Admitting: Nurse Practitioner

## 2016-08-21 VITALS — BP 126/74 | Ht 66.0 in | Wt 164.2 lb

## 2016-08-21 DIAGNOSIS — K219 Gastro-esophageal reflux disease without esophagitis: Secondary | ICD-10-CM | POA: Diagnosis not present

## 2016-08-21 DIAGNOSIS — Z23 Encounter for immunization: Secondary | ICD-10-CM | POA: Diagnosis not present

## 2016-08-21 DIAGNOSIS — F419 Anxiety disorder, unspecified: Secondary | ICD-10-CM

## 2016-08-21 DIAGNOSIS — Z79891 Long term (current) use of opiate analgesic: Secondary | ICD-10-CM | POA: Diagnosis not present

## 2016-08-21 MED ORDER — HYDROCODONE-ACETAMINOPHEN 5-325 MG PO TABS: 1.0000 | ORAL_TABLET | ORAL | 0 refills | Status: DC | PRN

## 2016-08-21 MED ORDER — PANTOPRAZOLE SODIUM 40 MG PO TBEC: 40.0000 mg | DELAYED_RELEASE_TABLET | Freq: Every day | ORAL | 5 refills | Status: DC

## 2016-08-21 MED ORDER — ALPRAZOLAM 0.5 MG PO TABS: 0.5000 mg | ORAL_TABLET | Freq: Two times a day (BID) | ORAL | 0 refills | Status: DC | PRN

## 2016-08-21 MED ORDER — KETOCONAZOLE 2 % EX CREA: TOPICAL_CREAM | CUTANEOUS | 0 refills | Status: DC

## 2016-08-21 NOTE — Progress Notes (Signed)
Subjective: This patient was seen today for chronic pain  The medication list was reviewed and updated.   -Compliance with medication: yes  - Number patient states they take daily: 0-3  -when was the last dose patient took? A week ago  The patient was advised the importance of maintaining medication and not using illegal substances with these.  Refills needed: yes  The patient was educated that we can provide 3 monthly scripts for their medication, it is their responsibility to follow the instructions.  Side effects or complications from medications: none   Patient is aware that pain medications are meant to minimize the severity of the pain to allow their pain levels to improve to allow for better function. They are aware of that pain medications cannot totally remove their pain.  Due for UDT ( at least once per year) : today [place] reviewed.   Reflux stable; take Protonix on a prn basis. Doing well with weight loss. Anxiety stable on Prozac. Takes a very rare Xanax. Taking birth control pills. Mood much improved.   Objective:  NAD. Alert, oriented. Lungs clear. Heart RRR. Abdomen soft, non tender.   Assessment:  Problem List Items Addressed This Visit      Digestive   GERD (gastroesophageal reflux disease) - Primary (Chronic)   Relevant Medications   pantoprazole (PROTONIX) 40 MG tablet     Other   Anxiety   Relevant Medications   ALPRAZolam (XANAX) 0.5 MG tablet    Other Visit Diagnoses    Need for vaccination       Relevant Orders   Flu Vaccine QUAD 36+ mos IM (Completed)   Encounter for long-term opiate analgesic use       Relevant Orders   ToxASSURE Select 13 (MW), Urine     Plan:  Meds ordered this encounter  Medications  . pantoprazole (PROTONIX) 40 MG tablet    Sig: Take 1 tablet (40 mg total) by mouth daily. For acid reflux    Dispense:  30 tablet    Refill:  5    Order Specific Question:   Supervising Provider    Answer:   Merlyn Albert [[id number]]   . ketoconazole (NIZORAL) 2 % cream    Sig: APPLY TOPICALLY TWO TIMES DAILY PRN    Dispense:  30 g    Refill:  0    Order Specific Question:   Supervising Provider    Answer:   Merlyn Albert [[id number]]  . ALPRAZolam (XANAX) 0.5 MG tablet    Sig: Take 1 tablet (0.5 mg total) by mouth 2 (two) times daily as needed for anxiety.    Dispense:  10 tablet    Refill:  0    Order Specific Question:   Supervising Provider    Answer:   Merlyn Albert [[id number]]  . DISCONTD: HYDROcodone-acetaminophen (NORCO/VICODIN) 5-325 MG tablet    Sig: Take 1 tablet by mouth every 4 (four) hours as needed.    Dispense:  30 tablet    Refill:  0    May fill 60 days from 08/21/16    Order Specific Question:   Supervising Provider    Answer:   Merlyn Albert [[id number]]  . DISCONTD: HYDROcodone-acetaminophen (NORCO/VICODIN) 5-325 MG tablet    Sig: Take 1 tablet by mouth every 4 (four) hours as needed.    Dispense:  30 tablet    Refill:  0    May fill 30 days from 08/21/16    Order Specific Question:  Supervising Provider    Answer:   Merlyn AlbertLUKING, WILLIAM S [[id number]]  . HYDROcodone-acetaminophen (NORCO/VICODIN) 5-325 MG tablet    Sig: Take 1 tablet by mouth every 4 (four) hours as needed.    Dispense:  30 tablet    Refill:  0    Order Specific Question:   Supervising Provider    Answer:   Merlyn AlbertLUKING, WILLIAM S [[id number]]   Return in about 3 months (around 11/21/2016) for recheck.

## 2016-08-22 ENCOUNTER — Encounter: Payer: Self-pay | Admitting: Nurse Practitioner

## 2016-08-28 LAB — TOXASSURE SELECT 13 (MW), URINE

## 2016-09-09 ENCOUNTER — Other Ambulatory Visit: Payer: Self-pay | Admitting: Nurse Practitioner

## 2016-09-13 NOTE — Telephone Encounter (Signed)
Prescribed by carolyn just a couple weeks ago and nte said "takes a very rare xanax", call pt and ask why so frequently lately

## 2016-11-20 ENCOUNTER — Ambulatory Visit: Payer: Medicare Other | Admitting: Nurse Practitioner

## 2017-01-06 ENCOUNTER — Other Ambulatory Visit: Payer: Self-pay | Admitting: Nurse Practitioner

## 2017-03-04 ENCOUNTER — Other Ambulatory Visit: Payer: Self-pay | Admitting: *Deleted

## 2017-03-04 MED ORDER — PANTOPRAZOLE SODIUM 40 MG PO TBEC: 40.0000 mg | DELAYED_RELEASE_TABLET | Freq: Every day | ORAL | 0 refills | Status: DC

## 2017-03-17 ENCOUNTER — Other Ambulatory Visit: Payer: Self-pay | Admitting: *Deleted

## 2017-03-17 MED ORDER — PANTOPRAZOLE SODIUM 40 MG PO TBEC: 40.0000 mg | DELAYED_RELEASE_TABLET | Freq: Every day | ORAL | 0 refills | Status: DC

## 2017-03-23 ENCOUNTER — Other Ambulatory Visit: Payer: Self-pay | Admitting: Family Medicine

## 2017-03-23 ENCOUNTER — Other Ambulatory Visit: Payer: Self-pay | Admitting: Nurse Practitioner

## 2017-04-10 ENCOUNTER — Other Ambulatory Visit: Payer: Self-pay | Admitting: Family Medicine

## 2017-04-11 ENCOUNTER — Other Ambulatory Visit: Payer: Self-pay | Admitting: Nurse Practitioner

## 2017-06-11 ENCOUNTER — Other Ambulatory Visit: Payer: Self-pay | Admitting: Family Medicine

## 2017-07-18 ENCOUNTER — Other Ambulatory Visit: Payer: Self-pay | Admitting: Nurse Practitioner

## 2017-08-08 ENCOUNTER — Other Ambulatory Visit: Payer: Self-pay | Admitting: Nurse Practitioner

## 2017-10-06 ENCOUNTER — Encounter: Payer: Self-pay | Admitting: Nurse Practitioner

## 2017-10-06 ENCOUNTER — Ambulatory Visit (INDEPENDENT_AMBULATORY_CARE_PROVIDER_SITE_OTHER): Payer: Medicare Other | Admitting: Nurse Practitioner

## 2017-10-06 VITALS — BP 114/84 | Ht 66.0 in | Wt 170.4 lb

## 2017-10-06 DIAGNOSIS — K219 Gastro-esophageal reflux disease without esophagitis: Secondary | ICD-10-CM | POA: Diagnosis not present

## 2017-10-06 DIAGNOSIS — Z23 Encounter for immunization: Secondary | ICD-10-CM

## 2017-10-06 DIAGNOSIS — F419 Anxiety disorder, unspecified: Secondary | ICD-10-CM | POA: Diagnosis not present

## 2017-10-06 DIAGNOSIS — G35 Multiple sclerosis: Secondary | ICD-10-CM

## 2017-10-06 DIAGNOSIS — G35D Multiple sclerosis, unspecified: Secondary | ICD-10-CM

## 2017-10-06 MED ORDER — HYDROCODONE-ACETAMINOPHEN 5-325 MG PO TABS: 1.0000 | ORAL_TABLET | ORAL | 0 refills | Status: DC | PRN

## 2017-10-06 MED ORDER — KETOCONAZOLE 2 % EX CREA: TOPICAL_CREAM | CUTANEOUS | 0 refills | Status: AC

## 2017-10-06 MED ORDER — ALPRAZOLAM 0.5 MG PO TABS: ORAL_TABLET | ORAL | 2 refills | Status: DC

## 2017-10-07 ENCOUNTER — Encounter: Payer: Self-pay | Admitting: Nurse Practitioner

## 2017-10-07 NOTE — Progress Notes (Signed)
Subjective: Presents for recheck on her chronic health issues.  Continues to see her MS specialist.  Has chronic bilateral low back pain after falling in the bathroom a few years ago.  Also has significant pain at times mainly during the winter due to her MS.  Takes he hydrocodone about once a week only for severe pain.  Overall doing well on Prozac 40 mg but has experienced slight increase in depression and anxiety lately.  Also relates this to wintertime and her pain level.  Has rare reflux symptoms.  Rare use of Xanax.  Takes a half of a trazodone at times for sleep which works well. [facility]  Decreased Interest 2  Down, Depressed, Hopeless 3  PHQ - 2 Score 5  Altered sleeping 3  Tired, decreased energy 1  Change in appetite 0  Feeling bad or failure about yourself  0  Trouble concentrating 1  Moving slowly or fidgety/restless 3  Suicidal thoughts 0  PHQ-9 Score 13  Difficult doing work/chores Somewhat difficult     Objective:   BP 114/84   Ht 5\' 6"  (1.676 m)   Wt 170 lb 6 oz (77.3 kg)   BMI 27.50 kg/m  NAD.  Alert, oriented.  Lungs clear.  Heart regular rate and rhythm.  Generalized tenderness in the lumbar area more towards the left side.  Abdomen soft nondistended with minimal upper epigastric area discomfort on exam.  No rebound or guarding.  No obvious masses.  Thoughts logical coherent and relevant.  Calm affect.  Dressed appropriately.  Making good eye contact.  Assessment:   Problem List Items Addressed This Visit      Digestive   GERD (gastroesophageal reflux disease) (Chronic)     Nervous and Auditory   Multiple sclerosis (HCC) - Primary (Chronic)     Other   Anxiety   Relevant Medications   ALPRAZolam (XANAX) 0.5 MG tablet    Other Visit Diagnoses    Need for influenza vaccination       Relevant Orders   Flu Vaccine QUAD 6+ mos PF IM (Fluarix Quad PF) (Completed)       Plan:   Meds ordered this encounter  Medications  .  HYDROcodone-acetaminophen (NORCO/VICODIN) 5-325 MG tablet    Sig: Take 1 tablet by mouth every 4 (four) hours as needed.    Dispense:  30 tablet    Refill:  0    Order Specific Question:   Supervising Provider    Answer:   Merlyn AlbertLUKING, WILLIAM S [[id number]]  . ALPRAZolam (XANAX) 0.5 MG tablet    Sig: TAKE 1 TABLET BY MOUTH TWO TIMES DAILY AS NEEDED FOR ANXIETY    Dispense:  10 tablet    Refill:  2    [facility]    Order Specific Question:   Supervising Provider    Answer:   Merlyn AlbertLUKING, WILLIAM S [[id number]]  . ketoconazole (NIZORAL) 2 % cream    Sig: APPLY TOPICALLY TWO TIMES DAILY AS NEEDED    Dispense:  30 g    Refill:  0    Order Specific Question:   Supervising Provider    Answer:   Merlyn AlbertLUKING, WILLIAM S [[id number]]   Continue to use hydrocodone and Xanax sparingly.  Do not take at the same time due to drowsiness potential.  Defers increase in Prozac dose at this time.  Recommend using pantoprazole on an as-needed basis if possible. Return in about 6 months (around 04/05/2018) for physical and follow up chronic health  issues.

## 2018-03-06 ENCOUNTER — Other Ambulatory Visit: Payer: Self-pay | Admitting: Nurse Practitioner

## 2018-03-07 NOTE — Telephone Encounter (Signed)
May have 1 refill needs office visit 

## 2018-04-06 ENCOUNTER — Encounter: Payer: Self-pay | Admitting: Nurse Practitioner

## 2018-04-06 ENCOUNTER — Ambulatory Visit (INDEPENDENT_AMBULATORY_CARE_PROVIDER_SITE_OTHER): Payer: Medicare Other | Admitting: Nurse Practitioner

## 2018-04-06 ENCOUNTER — Telehealth: Payer: Self-pay | Admitting: *Deleted

## 2018-04-06 VITALS — BP 122/74 | Ht 66.25 in | Wt 168.4 lb

## 2018-04-06 DIAGNOSIS — Z1151 Encounter for screening for human papillomavirus (HPV): Secondary | ICD-10-CM | POA: Diagnosis not present

## 2018-04-06 DIAGNOSIS — Z1231 Encounter for screening mammogram for malignant neoplasm of breast: Secondary | ICD-10-CM | POA: Diagnosis not present

## 2018-04-06 DIAGNOSIS — R5383 Other fatigue: Secondary | ICD-10-CM | POA: Diagnosis not present

## 2018-04-06 DIAGNOSIS — Z13220 Encounter for screening for lipoid disorders: Secondary | ICD-10-CM | POA: Diagnosis not present

## 2018-04-06 DIAGNOSIS — Z0000 Encounter for general adult medical examination without abnormal findings: Secondary | ICD-10-CM | POA: Diagnosis not present

## 2018-04-06 DIAGNOSIS — Z124 Encounter for screening for malignant neoplasm of cervix: Secondary | ICD-10-CM

## 2018-04-06 DIAGNOSIS — F419 Anxiety disorder, unspecified: Secondary | ICD-10-CM | POA: Diagnosis not present

## 2018-04-06 MED ORDER — ESCITALOPRAM OXALATE 10 MG PO TABS: 10.0000 mg | ORAL_TABLET | Freq: Every day | ORAL | 2 refills | Status: DC

## 2018-04-06 NOTE — Patient Instructions (Signed)
Call back if any problems with Lexapro; Discontinue immediately if worsens anxiety or depression

## 2018-04-06 NOTE — Telephone Encounter (Signed)
Please clarify: we usually give her 10 tabs which is what we have prescribed for a couple of years; only gets it every couple of months.

## 2018-04-06 NOTE — Telephone Encounter (Signed)
Pt seen today. Requesting refill on Alprazolam 0.5mg   One bid. Pt states she only got #10 tablets last refill but takes twice a day. Pt would like a call back after med is sent to pharm. Ok to leave message on voicemail.   [facility] [place]

## 2018-04-06 NOTE — Telephone Encounter (Signed)
She states she use to get #30 but then it was changed to #10 and she does not know why. I looked on history and she got #30 from 07/19/13 - 01/29/14. Started getting #10 on 12/11/15. I called pharm and they said she got #10 filled on 11/13/17, 01/27/18, and 03/08/18.

## 2018-04-06 NOTE — Telephone Encounter (Signed)
So just to be clear, she wants to go back to 30? I think she mainly uses it for sleep

## 2018-04-07 ENCOUNTER — Other Ambulatory Visit: Payer: Self-pay | Admitting: Nurse Practitioner

## 2018-04-07 ENCOUNTER — Encounter: Payer: Self-pay | Admitting: Nurse Practitioner

## 2018-04-07 MED ORDER — ALPRAZOLAM 0.5 MG PO TABS: 0.5000 mg | ORAL_TABLET | Freq: Every evening | ORAL | 2 refills | Status: DC | PRN

## 2018-04-07 NOTE — Progress Notes (Signed)
Subjective:    Patient ID: Stacey Kim, female    DOB: 09-03-1976, 42 y.o.   MRN: [id number]  HPI presents for her wellness exam. Does not have a cycle on her oc's. Non smoker. Same sexual partner. Trying to walk for her activity. Takes Protonix about once every 2 weeks. Takes Xanax mainly for sleep. Trazodone alone is not enough. Has been under more stress lately. Has noticed fatigue and emotional lability. Would like to restart daily medicine.      Review of Systems  Constitutional: Positive for fatigue. Negative for activity change and appetite change.  HENT: Negative for dental problem, ear pain, sinus pressure and sore throat.   Respiratory: Negative for cough, chest tightness, shortness of breath and wheezing.   Cardiovascular: Negative for chest pain.  Gastrointestinal: Negative for abdominal distention, abdominal pain, blood in stool, constipation, diarrhea, nausea and vomiting.  Genitourinary: Negative for difficulty urinating, dysuria, enuresis, frequency, genital sores, menstrual problem, pelvic pain, urgency, vaginal bleeding and vaginal discharge.  Psychiatric/Behavioral: Positive for sleep disturbance. Negative for suicidal ideas. The patient is nervous/anxious.        Objective:   Physical Exam  Constitutional: She is oriented to person, place, and time. She appears well-developed. No distress.  HENT:  Right Ear: External ear normal.  Left Ear: External ear normal.  Mouth/Throat: Oropharynx is clear and moist.  Neck: Normal range of motion. Neck supple. No tracheal deviation present. No thyromegaly present.  Cardiovascular: Normal rate, regular rhythm and normal heart sounds. Exam reveals no gallop.  No murmur heard. Pulmonary/Chest: Effort normal and breath sounds normal. Right breast exhibits no inverted nipple, no mass, no skin change and no tenderness. Left breast exhibits no inverted nipple, no mass, no skin change and no tenderness. Breasts are symmetrical.    Abdominal: Soft. She exhibits no distension. There is no tenderness.  Genitourinary: Vagina normal and uterus normal. No vaginal discharge found.  Genitourinary Comments: External GU: no rashes or lesions. Vagina: no discharge. Cervix normal in appearance. No CMT. Bimanual exam: no tenderness or obvious masses.   Musculoskeletal: She exhibits no edema.  Lymphadenopathy:    She has no cervical adenopathy.  Neurological: She is alert and oriented to person, place, and time.  Skin: Skin is warm and dry. No rash noted.  Psychiatric: She has a normal mood and affect. Her behavior is normal.          Assessment & Plan:   Problem List Items Addressed This Visit      Other   Anxiety   Relevant Medications   escitalopram (LEXAPRO) 10 MG tablet    Other Visit Diagnoses    Routine general medical examination at a health care facility    -  Primary   Relevant Orders   Pap IG and HPV (high risk) DNA detection   Lipid panel   Hepatic function panel   Basic metabolic panel   CBC with Differential/Platelet   TSH   VITAMIN D 25 Hydroxy (Vit-D Deficiency, Fractures)   Screening for HPV (human papillomavirus)       Relevant Orders   Pap IG and HPV (high risk) DNA detection   Lipid panel   Hepatic function panel   Basic metabolic panel   CBC with Differential/Platelet   TSH   VITAMIN D 25 Hydroxy (Vit-D Deficiency, Fractures)   Screening for cervical cancer       Relevant Orders   Pap IG and HPV (high risk) DNA detection   Lipid panel  Hepatic function panel   Basic metabolic panel   CBC with Differential/Platelet   TSH   VITAMIN D 25 Hydroxy (Vit-D Deficiency, Fractures)   Encounter for screening mammogram for breast cancer       Relevant Orders   MM DIGITAL SCREENING BILATERAL   Lipid panel   Hepatic function panel   Basic metabolic panel   CBC with Differential/Platelet   TSH   VITAMIN D 25 Hydroxy (Vit-D Deficiency, Fractures)   Screening for lipid disorders        Relevant Orders   Lipid panel   Other fatigue       Relevant Orders   CBC with Differential/Platelet   TSH   VITAMIN D 25 Hydroxy (Vit-D Deficiency, Fractures)     Meds ordered this encounter  Medications  . escitalopram (LEXAPRO) 10 MG tablet    Sig: Take 1 tablet (10 mg total) by mouth daily.    Dispense:  30 tablet    Refill:  2    Order Specific Question:   Supervising Provider    Answer:   Merlyn Albert [[id number]]   Take Xanax at bedtime as needed for sleep. Encouraged regular activity, healthy diet and continued weight loss efforts.  Reviewed potential adverse effects of Lexapro. DC med and contact office if any problems.  Return in about 3 months (around 07/07/2018) for recheck. Call back sooner if needed.

## 2018-04-07 NOTE — Telephone Encounter (Signed)
Done

## 2018-04-07 NOTE — Telephone Encounter (Signed)
Yes she wants #30 at a time

## 2018-04-08 NOTE — Telephone Encounter (Signed)
Pt.notified

## 2018-04-09 LAB — PAP IG AND HPV HIGH-RISK
HPV, HIGH-RISK: NEGATIVE
PAP SMEAR COMMENT: 0

## 2018-04-13 ENCOUNTER — Ambulatory Visit (HOSPITAL_COMMUNITY): Payer: Medicare Other

## 2018-04-20 ENCOUNTER — Ambulatory Visit (HOSPITAL_COMMUNITY)
Admission: RE | Admit: 2018-04-20 | Discharge: 2018-04-20 | Disposition: A | Discharge status: Home / Self Care | Payer: Medicare Other | Source: Ambulatory Visit | Attending: Nurse Practitioner | Admitting: Nurse Practitioner

## 2018-04-20 ENCOUNTER — Encounter (HOSPITAL_COMMUNITY): Payer: Self-pay | Admitting: Radiology

## 2018-04-20 ENCOUNTER — Other Ambulatory Visit: Payer: Self-pay | Admitting: Nurse Practitioner

## 2018-04-20 DIAGNOSIS — Z0000 Encounter for general adult medical examination without abnormal findings: Secondary | ICD-10-CM | POA: Diagnosis not present

## 2018-04-20 DIAGNOSIS — Z124 Encounter for screening for malignant neoplasm of cervix: Secondary | ICD-10-CM | POA: Diagnosis not present

## 2018-04-20 DIAGNOSIS — R928 Other abnormal and inconclusive findings on diagnostic imaging of breast: Secondary | ICD-10-CM

## 2018-04-20 DIAGNOSIS — Z1231 Encounter for screening mammogram for malignant neoplasm of breast: Secondary | ICD-10-CM | POA: Diagnosis not present

## 2018-04-20 DIAGNOSIS — R5383 Other fatigue: Secondary | ICD-10-CM | POA: Diagnosis not present

## 2018-04-20 DIAGNOSIS — Z1151 Encounter for screening for human papillomavirus (HPV): Secondary | ICD-10-CM | POA: Diagnosis not present

## 2018-04-20 DIAGNOSIS — Z13220 Encounter for screening for lipoid disorders: Secondary | ICD-10-CM | POA: Diagnosis not present

## 2018-04-21 ENCOUNTER — Other Ambulatory Visit: Payer: Self-pay | Admitting: Nurse Practitioner

## 2018-04-21 LAB — BASIC METABOLIC PANEL
BUN / CREAT RATIO: 8 — AB (ref 9–23)
BUN: 9 mg/dL (ref 6–24)
CO2: 25 mmol/L (ref 20–29)
CREATININE: 1.06 mg/dL — AB (ref 0.57–1.00)
Calcium: 9.8 mg/dL (ref 8.7–10.2)
Chloride: 105 mmol/L (ref 96–106)
GFR calc Af Amer: 75 mL/min/{1.73_m2} (ref >59)
GFR, EST NON AFRICAN AMERICAN: 65 mL/min/{1.73_m2} (ref >59)
Glucose: 97 mg/dL (ref 65–99)
POTASSIUM: 4.4 mmol/L (ref 3.5–5.2)
SODIUM: 144 mmol/L (ref 134–144)

## 2018-04-21 LAB — HEPATIC FUNCTION PANEL
ALBUMIN: 4.6 g/dL (ref 3.5–5.5)
ALT: 7 IU/L (ref 0–32)
AST: 11 IU/L (ref 0–40)
Alkaline Phosphatase: 75 IU/L (ref 39–117)
BILIRUBIN, DIRECT: 0.11 mg/dL (ref 0.00–0.40)
Bilirubin Total: 0.3 mg/dL (ref 0.0–1.2)
TOTAL PROTEIN: 7.6 g/dL (ref 6.0–8.5)

## 2018-04-21 LAB — CBC WITH DIFFERENTIAL/PLATELET
Basophils Absolute: 0 10*3/uL (ref 0.0–0.2)
Basos: 1 %
EOS (ABSOLUTE): 0.1 10*3/uL (ref 0.0–0.4)
Eos: 1 %
Hematocrit: 36.1 % (ref 34.0–46.6)
Hemoglobin: 11.6 g/dL (ref 11.1–15.9)
IMMATURE GRANULOCYTES: 0 %
Immature Grans (Abs): 0 10*3/uL (ref 0.0–0.1)
Lymphocytes Absolute: 2.7 10*3/uL (ref 0.7–3.1)
Lymphs: 54 %
MCH: 26.9 pg (ref 26.6–33.0)
MCHC: 32.1 g/dL (ref 31.5–35.7)
MCV: 84 fL (ref 79–97)
MONOS ABS: 0.3 10*3/uL (ref 0.1–0.9)
Monocytes: 6 %
NEUTROS PCT: 38 %
Neutrophils Absolute: 1.9 10*3/uL (ref 1.4–7.0)
Platelets: 329 10*3/uL (ref 150–450)
RBC: 4.31 x10E6/uL (ref 3.77–5.28)
RDW: 14.1 % (ref 12.3–15.4)
WBC: 5 10*3/uL (ref 3.4–10.8)

## 2018-04-21 LAB — LIPID PANEL
CHOLESTEROL TOTAL: 193 mg/dL (ref 100–199)
Chol/HDL Ratio: 4.4 ratio (ref 0.0–4.4)
HDL: 44 mg/dL (ref >39)
LDL Calculated: 131 mg/dL — ABNORMAL HIGH (ref 0–99)
Triglycerides: 90 mg/dL (ref 0–149)
VLDL CHOLESTEROL CAL: 18 mg/dL (ref 5–40)

## 2018-04-21 LAB — TSH: TSH: 3.51 u[IU]/mL (ref 0.450–4.500)

## 2018-04-21 LAB — VITAMIN D 25 HYDROXY (VIT D DEFICIENCY, FRACTURES): VIT D 25 HYDROXY: 20.1 ng/mL — AB (ref 30.0–100.0)

## 2018-04-21 MED ORDER — VITAMIN D (ERGOCALCIFEROL) 1.25 MG (50000 UNIT) PO CAPS: 50000.0000 [IU] | ORAL_CAPSULE | ORAL | 2 refills | Status: DC

## 2018-04-26 ENCOUNTER — Ambulatory Visit (HOSPITAL_COMMUNITY)
Admission: RE | Admit: 2018-04-26 | Discharge: 2018-04-26 | Disposition: A | Discharge status: Home / Self Care | Payer: Medicare Other | Source: Ambulatory Visit | Attending: Nurse Practitioner | Admitting: Nurse Practitioner

## 2018-04-26 ENCOUNTER — Encounter (HOSPITAL_COMMUNITY): Payer: Medicare Other

## 2018-04-26 DIAGNOSIS — R928 Other abnormal and inconclusive findings on diagnostic imaging of breast: Secondary | ICD-10-CM

## 2018-04-26 DIAGNOSIS — N6311 Unspecified lump in the right breast, upper outer quadrant: Secondary | ICD-10-CM | POA: Diagnosis not present

## 2018-04-28 ENCOUNTER — Other Ambulatory Visit: Payer: Self-pay | Admitting: Nurse Practitioner

## 2018-04-28 ENCOUNTER — Telehealth: Payer: Self-pay | Admitting: Nurse Practitioner

## 2018-04-28 DIAGNOSIS — N6310 Unspecified lump in the right breast, unspecified quadrant: Secondary | ICD-10-CM

## 2018-04-28 DIAGNOSIS — N6489 Other specified disorders of breast: Secondary | ICD-10-CM

## 2018-04-28 NOTE — Telephone Encounter (Signed)
Patient called today asking about her appt with the breast center.  She wanted to know the status.  I found the order and called the breast center at [phone] and scheduled the patient for Friday 05-06-18 at 7:30 am with arrival at 7:15 (only time they do this test) and 2 hour procedure.  Patient was ok with this date and time and understood directions to the center.  I gave her the phone number to call them with any questions and I also faxed Carolyn's order over to the breast center at [phone]. Enid Derry said the referral had already come through and she has approved it.

## 2018-05-06 ENCOUNTER — Ambulatory Visit
Admission: RE | Admit: 2018-05-06 | Discharge: 2018-05-06 | Disposition: A | Discharge status: Home / Self Care | Payer: Medicare Other | Source: Ambulatory Visit | Attending: Nurse Practitioner | Admitting: Nurse Practitioner

## 2018-05-06 DIAGNOSIS — N6310 Unspecified lump in the right breast, unspecified quadrant: Secondary | ICD-10-CM

## 2018-05-06 DIAGNOSIS — N6489 Other specified disorders of breast: Secondary | ICD-10-CM

## 2018-05-06 DIAGNOSIS — N6011 Diffuse cystic mastopathy of right breast: Secondary | ICD-10-CM | POA: Diagnosis not present

## 2018-05-06 DIAGNOSIS — N6311 Unspecified lump in the right breast, upper outer quadrant: Secondary | ICD-10-CM | POA: Diagnosis not present

## 2018-05-06 DIAGNOSIS — R921 Mammographic calcification found on diagnostic imaging of breast: Secondary | ICD-10-CM | POA: Diagnosis not present

## 2018-06-01 ENCOUNTER — Telehealth: Payer: Self-pay | Admitting: *Deleted

## 2018-06-01 NOTE — Telephone Encounter (Signed)
Patient requests new prescription for birth control sent to [facility]

## 2018-06-02 ENCOUNTER — Other Ambulatory Visit: Payer: Self-pay | Admitting: Nurse Practitioner

## 2018-06-02 MED ORDER — NORETHINDRONE ACET-ETHINYL EST 1-20 MG-MCG PO TABS: 1.0000 | ORAL_TABLET | Freq: Every day | ORAL | 13 refills | Status: DC

## 2018-06-02 NOTE — Telephone Encounter (Signed)
Done

## 2018-07-07 ENCOUNTER — Ambulatory Visit: Payer: Medicare Other | Admitting: Family Medicine

## 2018-08-03 ENCOUNTER — Telehealth: Payer: Self-pay

## 2018-08-03 ENCOUNTER — Other Ambulatory Visit: Payer: Self-pay | Admitting: Family Medicine

## 2018-08-03 DIAGNOSIS — R5383 Other fatigue: Secondary | ICD-10-CM

## 2018-08-03 NOTE — Telephone Encounter (Signed)
According to Carolyn's last note back in June it was recommended to use this medication for 3 months then take OTC 2000 units of vitamin D daily Unless there is a different reason that I need to know about this is what I would recommend-as stated above It would be wise for the patient to repeat vitamin D level this fall

## 2018-08-03 NOTE — Telephone Encounter (Signed)
Requesting refill on Vit D 2 1.25 mg one capsule po Q 7 days.[facility].

## 2018-08-03 NOTE — Telephone Encounter (Signed)
Contacted patient. Pt states she will take OTC Vit D daily. Informed patient that provider would like her to do a Vit D level this fall. Lab ordered and pt is aware.

## 2018-08-15 ENCOUNTER — Other Ambulatory Visit: Payer: Self-pay

## 2018-08-15 MED ORDER — VITAMIN D (ERGOCALCIFEROL) 1.25 MG (50000 UNIT) PO CAPS: 50000.0000 [IU] | ORAL_CAPSULE | ORAL | 2 refills | Status: DC

## 2018-10-11 ENCOUNTER — Telehealth: Payer: Self-pay | Admitting: Family Medicine

## 2018-10-11 ENCOUNTER — Ambulatory Visit (INDEPENDENT_AMBULATORY_CARE_PROVIDER_SITE_OTHER): Payer: Medicare Other | Admitting: Family Medicine

## 2018-10-11 ENCOUNTER — Encounter: Payer: Self-pay | Admitting: Family Medicine

## 2018-10-11 ENCOUNTER — Other Ambulatory Visit: Payer: Self-pay | Admitting: Family Medicine

## 2018-10-11 VITALS — BP 132/78 | Wt 169.0 lb

## 2018-10-11 DIAGNOSIS — Z23 Encounter for immunization: Secondary | ICD-10-CM | POA: Diagnosis not present

## 2018-10-11 DIAGNOSIS — N950 Postmenopausal bleeding: Secondary | ICD-10-CM

## 2018-10-11 MED ORDER — HYDROCODONE-ACETAMINOPHEN 5-325 MG PO TABS: ORAL_TABLET | ORAL | 0 refills | Status: DC

## 2018-10-11 NOTE — Telephone Encounter (Signed)
The main reason is this requires a specific pain medication visit.  Under the current guidelines I can send them a short prescription but a face-to-face office visit before receiving more is necessary specifically for a pain management visit-in other words not combined with other health issues The patient was seen today more so for female health issues and that is why it was not addressed on today's visit  Please inform the patient that I will send in this medication later this evening she should be able to pick it up tomorrow

## 2018-10-11 NOTE — Progress Notes (Signed)
   Subjective:    Patient ID: Stacey Kim, female    DOB: [birth year], 42 y.o.   MRN: [id number]  HPI Pt here today due to having a cycle last week. Pt has not had a cycle in about 6 years. Pt states she had no symptoms of menstruation and she is suppose to be going through menopause.   Has seen GYN in [place] a long time ago was told she was going through early menopause. Lab work done here in 2015 confirms early menopause as well.   Had menses x 7 days starting last week, denies any symptoms of cramping. Reports first time she's had vaginal bleeding in 18 years since the birth of her daughter.  Sexually active, 1 partner, denies pelvic pain, vaginal discharge or irritation, has been taking OCPs for hormone levels, reports bleeding occurred during inactive pills.  Upon chart review similar episode of abnormal uterine bleeding in Feb 2017. U/S then showed thickened endometrial lining, was told to continue with her OCPs. No other f/u was done at that time.   Review of Systems  Constitutional: Negative for fever and unexpected weight change.  Gastrointestinal: Negative for abdominal pain.  Genitourinary: Positive for vaginal bleeding. Negative for dysuria, pelvic pain, vaginal discharge and vaginal pain.       Objective:   Physical Exam  Constitutional: She is oriented to person, place, and time. She appears well-developed and well-nourished. No distress.  HENT:  Head: Normocephalic and atraumatic.  Neck: Neck supple.  Cardiovascular: Normal rate, regular rhythm and normal heart sounds.  No murmur heard. Pulmonary/Chest: Effort normal and breath sounds normal. No respiratory distress.  Neurological: She is alert and oriented to person, place, and time.  Skin: Skin is warm and dry.  Psychiatric: She has a normal mood and affect.  Nursing note and vitals reviewed.          Assessment & Plan:  1. Postmenopausal bleeding Pelvic ultrasound ordered and referral placed to GYN for  further workup and management of her postmenopausal bleeding as well as management of her HRT for her early menopause. Pt prefers GYN in [place]  2. Need for vaccination - Plan: Flu Vaccine QUAD 6+ mos PF IM (Fluarix Quad PF)  25 minutes was spent with the patient.  This statement verifies that 25 minutes was indeed spent with the patient.  More than 50% of this visit-total duration of the visit-was spent in counseling and coordination of care. The issues that the patient came in for today as reflected in the diagnosis (s) please refer to documentation for further details.  Dr. Lilyan Punt was consulted on this case and is in agreement with the above treatment plan.

## 2018-10-11 NOTE — Telephone Encounter (Signed)
[facility] Patient will need follow-up office visit before further pain medic medication

## 2018-10-11 NOTE — Telephone Encounter (Signed)
Patient is aware 

## 2018-10-11 NOTE — Telephone Encounter (Signed)
Patient requests her pain medicine to eat and [facility]

## 2018-10-11 NOTE — Telephone Encounter (Signed)
Pt is requesting a refill on HYDROcodone-acetaminophen (NORCO/VICODIN) 5-325 MG tablet. Please send to [id number] [place], [place] - [address]. Pt asked Lillia Abed for a refill today due to her MS and legs hurting. Pt doesn't understand why Lillia Abed wasn't able to refill the medication for her.

## 2018-10-11 NOTE — Telephone Encounter (Signed)
Please advise. Thank you

## 2018-10-11 NOTE — Telephone Encounter (Signed)
I called and left message to r.c. 

## 2018-10-12 ENCOUNTER — Telehealth: Payer: Self-pay | Admitting: Family Medicine

## 2018-10-12 DIAGNOSIS — N950 Postmenopausal bleeding: Secondary | ICD-10-CM

## 2018-10-12 NOTE — Telephone Encounter (Signed)
Please change U/S order so I may schedule & notify pt   Per Centralized Scheduling it needs to be U/S pelvis complete with trans abdominal   (Img 400 is what she said)

## 2018-10-12 NOTE — Telephone Encounter (Signed)
Order changed in epic

## 2018-10-12 NOTE — Addendum Note (Signed)
Addended by: Metro Kung on: 10/12/2018 10:35 AM   Modules accepted: Orders

## 2018-10-17 ENCOUNTER — Ambulatory Visit (HOSPITAL_COMMUNITY): Admission: RE | Admit: 2018-10-17 | Payer: Medicare Other | Source: Ambulatory Visit

## 2018-10-17 ENCOUNTER — Telehealth: Payer: Self-pay | Admitting: Family Medicine

## 2018-10-17 MED ORDER — ALPRAZOLAM 0.5 MG PO TABS: 0.5000 mg | ORAL_TABLET | Freq: Every evening | ORAL | 2 refills | Status: DC | PRN

## 2018-10-17 NOTE — Telephone Encounter (Signed)
May have this with 2 additional refills

## 2018-10-17 NOTE — Telephone Encounter (Signed)
Pharmacy requesting refill on Alprazolam 0.5 mg tablet. Take one tablet by mouth at bedtime as needed

## 2018-10-17 NOTE — Telephone Encounter (Signed)
Prescription faxed to pharmacy.

## 2018-10-19 ENCOUNTER — Ambulatory Visit (HOSPITAL_COMMUNITY): Payer: Medicare Other

## 2018-10-25 ENCOUNTER — Encounter: Payer: Self-pay | Admitting: Family Medicine

## 2018-11-09 ENCOUNTER — Other Ambulatory Visit: Payer: Self-pay | Admitting: Family Medicine

## 2018-11-13 NOTE — Telephone Encounter (Signed)
I would recommend refusing refills on this until the patient does her vitamin D level

## 2018-11-16 ENCOUNTER — Telehealth: Payer: Self-pay | Admitting: *Deleted

## 2018-11-16 ENCOUNTER — Encounter: Payer: Self-pay | Admitting: Family Medicine

## 2018-11-16 NOTE — Telephone Encounter (Signed)
[facility] [place] [facility] for [facility]ill of patient's Lexapro 10 mg

## 2018-11-17 MED ORDER — ESCITALOPRAM OXALATE 10 MG PO TABS: 10.0000 mg | ORAL_TABLET | Freq: Every day | ORAL | 0 refills | Status: DC

## 2018-11-17 NOTE — Telephone Encounter (Signed)
Prescription sent electronically to pharmacy. 

## 2018-11-17 NOTE — Telephone Encounter (Signed)
May refill x 2. Patient needs office visit with either myself or Dr. Lorin PicketScott to follow up on her chronic health issues and refill her medications.

## 2018-12-02 ENCOUNTER — Other Ambulatory Visit: Payer: Self-pay | Admitting: Family Medicine

## 2018-12-02 DIAGNOSIS — N6489 Other specified disorders of breast: Secondary | ICD-10-CM

## 2018-12-09 ENCOUNTER — Ambulatory Visit
Admission: RE | Admit: 2018-12-09 | Discharge: 2018-12-09 | Disposition: A | Discharge status: Home / Self Care | Payer: Medicare Other | Source: Ambulatory Visit | Attending: Family Medicine | Admitting: Family Medicine

## 2018-12-09 ENCOUNTER — Ambulatory Visit: Admission: RE | Admit: 2018-12-09 | Payer: Medicare Other | Source: Ambulatory Visit

## 2018-12-09 DIAGNOSIS — R928 Other abnormal and inconclusive findings on diagnostic imaging of breast: Secondary | ICD-10-CM | POA: Diagnosis not present

## 2018-12-09 DIAGNOSIS — N6489 Other specified disorders of breast: Secondary | ICD-10-CM

## 2018-12-27 ENCOUNTER — Telehealth: Payer: Self-pay | Admitting: *Deleted

## 2018-12-27 NOTE — Telephone Encounter (Signed)
Reminder in tickler file to follow up end of February on pt's diagnostic mammo. It was done jan 31st. In epic for review.

## 2018-12-28 NOTE — Telephone Encounter (Signed)
Her test came back negative, the radiologist discussed the results with her and recommended a follow-up regular screening mammogram in 6 months the patient is aware thank you

## 2019-02-20 ENCOUNTER — Telehealth: Payer: Self-pay | Admitting: Family Medicine

## 2019-02-20 NOTE — Telephone Encounter (Signed)
Pt returned call. Pt transferred up front to set up appt. Pt verbalized understanding.

## 2019-02-20 NOTE — Telephone Encounter (Signed)
I agree with this  

## 2019-02-20 NOTE — Telephone Encounter (Signed)
Pharmacy requesting refill on Alprazolam 0.5 mg tablet. Take one tablet by mouth at bedtime prn. Pt last seen 10/11/18 for postmenopausal bleeding. Tried to contact patient to inform her that she will need office visit for follow up on chronic med. Left message to return call

## 2019-02-22 ENCOUNTER — Ambulatory Visit (INDEPENDENT_AMBULATORY_CARE_PROVIDER_SITE_OTHER): Payer: Medicare Other | Admitting: Family Medicine

## 2019-02-22 ENCOUNTER — Other Ambulatory Visit: Payer: Self-pay

## 2019-02-22 ENCOUNTER — Encounter: Payer: Self-pay | Admitting: Family Medicine

## 2019-02-22 VITALS — Wt 150.0 lb

## 2019-02-22 DIAGNOSIS — F419 Anxiety disorder, unspecified: Secondary | ICD-10-CM

## 2019-02-22 MED ORDER — ALPRAZOLAM 0.5 MG PO TABS: 0.5000 mg | ORAL_TABLET | Freq: Every evening | ORAL | 5 refills | Status: DC | PRN

## 2019-02-22 MED ORDER — ESCITALOPRAM OXALATE 10 MG PO TABS: 10.0000 mg | ORAL_TABLET | Freq: Every day | ORAL | 1 refills | Status: DC

## 2019-02-22 NOTE — Progress Notes (Signed)
   Subjective:    Patient ID: Stacey Kim, female    DOB: 04/25/1976, 43 y.o.   MRN: [id number] Telephone visit unable to do video did not have video capability Coronavirus outbreak  HPI Pt needing refill on Xanax. Pt is not having any other problems. Pharmacy sent request for refill and pt was told she needed a phone visit with provider to get refills.  Patient somewhat stressed about coronavirus she denies being depressed but does states she takes her medicine regular basis does not abuse her medicine and is requesting refills to be sent in.  Denies any major medical setbacks.  Is staying at home to try to avoid getting sick coronavirus protection was discussed with the patient Virtual Visit via Telephone Note  I connected with Stacey Kim on 02/22/19 at  1:40 PM EDT by telephone and verified that I am speaking with the correct person using two identifiers.   I discussed the limitations, risks, security and privacy concerns of performing an evaluation and management service by telephone and the availability of in person appointments. I also discussed with the patient that there may be a patient responsible charge related to this service. The patient expressed understanding and agreed to proceed.   History of Present Illness:    Observations/Objective:   Assessment and Plan:   Follow Up Instructions:    I discussed the assessment and treatment plan with the patient. The patient was provided an opportunity to ask questions and all were answered. The patient agreed with the plan and demonstrated an understanding of the instructions.   The patient was advised to call back or seek an in-person evaluation if the symptoms worsen or if the condition fails to improve as anticipated.  I provided 15 minutes of non-face-to-face time during this encounter.   Marlowe Shores, LPN    Review of Systems     Objective:   Physical Exam        Assessment & Plan:  Generalized  anxiety Denies being depressed currently Refills on Xanax given Refills on Lexapro given Warning signs regarding coronavirus were discussed Call us if any problems

## 2019-03-27 DIAGNOSIS — G35 Multiple sclerosis: Secondary | ICD-10-CM | POA: Diagnosis not present

## 2019-06-25 ENCOUNTER — Other Ambulatory Visit: Payer: Self-pay | Admitting: Nurse Practitioner

## 2019-06-25 ENCOUNTER — Other Ambulatory Visit: Payer: Self-pay | Admitting: Family Medicine

## 2019-06-26 NOTE — Telephone Encounter (Signed)
May have 6 months I recommend the patient schedule a wellness checkup with Chrys Racer for later this year

## 2019-06-26 NOTE — Telephone Encounter (Signed)
May have 90-day prescription of each Virtual or in person visit this fall

## 2019-08-14 ENCOUNTER — Other Ambulatory Visit: Payer: Self-pay | Admitting: Family Medicine

## 2019-08-14 DIAGNOSIS — Z23 Encounter for immunization: Secondary | ICD-10-CM | POA: Diagnosis not present

## 2019-08-14 NOTE — Telephone Encounter (Signed)
Patient may have 1 refill I would recommend she do a vitamin D level in approximately 2-1/2 months to see where her level is

## 2019-08-14 NOTE — Telephone Encounter (Signed)
Duplicate

## 2019-08-15 MED ORDER — VITAMIN D (ERGOCALCIFEROL) 1.25 MG (50000 UNIT) PO CAPS: 50000.0000 [IU] | ORAL_CAPSULE | ORAL | 0 refills | Status: DC

## 2019-08-15 NOTE — Addendum Note (Signed)
Addended by: Dairl Ponder on: 08/15/2019 10:28 AM   Modules accepted: Orders

## 2019-10-02 ENCOUNTER — Other Ambulatory Visit: Payer: Self-pay | Admitting: Family Medicine

## 2019-10-21 NOTE — Telephone Encounter (Signed)
Needs to do a virtual follow-up please

## 2019-10-23 NOTE — Telephone Encounter (Signed)
Pleases schedule

## 2019-10-23 NOTE — Telephone Encounter (Signed)
Left message

## 2019-10-26 NOTE — Telephone Encounter (Signed)
Scheduled 12/21

## 2019-10-30 ENCOUNTER — Other Ambulatory Visit: Payer: Self-pay

## 2019-10-30 ENCOUNTER — Encounter: Payer: Medicare Other | Admitting: Family Medicine

## 2019-11-09 ENCOUNTER — Other Ambulatory Visit: Payer: Self-pay | Admitting: Family Medicine

## 2019-11-12 NOTE — Telephone Encounter (Signed)
Needs a set up virtual visit may have 1 refill 

## 2019-11-13 NOTE — Telephone Encounter (Signed)
Please schedule and then route back thanks 

## 2019-11-13 NOTE — Telephone Encounter (Signed)
Pt states when she bends over and when she bends down her right leg locks and then it feels like she dragging her foot. Started a couple of days ago. Advised pt she would need visit. Do you want to see her in office or virtual

## 2019-11-13 NOTE — Telephone Encounter (Signed)
Scheduled 1/12.   Pt would also like a steroid called in for leg locking up.

## 2019-11-17 ENCOUNTER — Ambulatory Visit: Payer: Medicare Other | Admitting: Nurse Practitioner

## 2019-11-21 ENCOUNTER — Encounter: Payer: Medicare Other | Admitting: Family Medicine

## 2019-11-21 ENCOUNTER — Other Ambulatory Visit: Payer: Self-pay

## 2019-11-21 NOTE — Progress Notes (Signed)
   Subjective:    Patient ID: Stacey Kim, female    DOB: 26-Apr-1976, 44 y.o.   MRN: [id number]  HPI    Review of Systems     Objective:   Physical Exam        Assessment & Plan:

## 2019-11-22 NOTE — Progress Notes (Signed)
This encounter was created in error - please disregard.

## 2019-11-26 NOTE — Progress Notes (Signed)
This encounter was created in error - please disregard.

## 2019-12-08 ENCOUNTER — Encounter: Payer: Self-pay | Admitting: Nurse Practitioner

## 2019-12-08 ENCOUNTER — Ambulatory Visit (INDEPENDENT_AMBULATORY_CARE_PROVIDER_SITE_OTHER): Payer: Medicare Other | Admitting: Nurse Practitioner

## 2019-12-08 ENCOUNTER — Other Ambulatory Visit: Payer: Self-pay

## 2019-12-08 DIAGNOSIS — G35 Multiple sclerosis: Secondary | ICD-10-CM | POA: Diagnosis not present

## 2019-12-08 DIAGNOSIS — M79661 Pain in right lower leg: Secondary | ICD-10-CM

## 2019-12-08 DIAGNOSIS — M79662 Pain in left lower leg: Secondary | ICD-10-CM

## 2019-12-08 MED ORDER — HYDROCODONE-ACETAMINOPHEN 5-325 MG PO TABS: ORAL_TABLET | ORAL | 0 refills | Status: DC

## 2019-12-08 MED ORDER — TRIAMCINOLONE ACETONIDE 0.1 % EX CREA: 1.0000 "application " | TOPICAL_CREAM | Freq: Two times a day (BID) | CUTANEOUS | 0 refills | Status: AC

## 2019-12-08 NOTE — Progress Notes (Signed)
   Subjective:    Patient ID: Stacey Kim, female    DOB: 12-09-1975, 44 y.o.   MRN: [id number]  HPI  Patient calls to discuss worsening pain in her legs.  Virtual Visit via Video Note  I connected with Stacey Kim on 12/08/19 at  1:40 PM EST by a video enabled telemedicine application and verified that I am speaking with the correct person using two identifiers.  Location: Patient: home Provider: office   I discussed the limitations of evaluation and management by telemedicine and the availability of in person appointments. The patient expressed understanding and agreed to proceed.  History of Present Illness: Patient presents by video to discuss a flareup of her lower leg pain.  Has multiple sclerosis.  Is currently on Betaseron therapy.  Mainly occurs during the winter with cold weather.  Describes shooting pains from her knees to her feet which cause extreme difficulty with walking.  Some weakness.  Has difficulty getting to the bathroom, needs her daughter's assistance at times.  Last winter patient used a short-term prescription of pain medication which allowed her to get through that episode.  Is working on getting an appointment with her MS specialist.  At the end of the visit patient mentioned that she is also having an eczema flareup and needs a refill of her triamcinolone cream.   Observations/Objective: Format  Patient present at home Provider present at office Consent for interaction obtained Coronavirus outbreak made virtual visit necessary  Alert, oriented.  Thoughts logical coherent and relevant.  Assessment and Plan: Problem List Items Addressed This Visit      Nervous and Auditory   Multiple sclerosis (HCC) - Primary (Chronic)    Other Visit Diagnoses    Pain in both lower legs         Meds ordered this encounter  Medications  . triamcinolone cream (KENALOG) 0.1 %    Sig: Apply 1 application topically 2 (two) times daily. To rash prn up to 2 weeks  at a time    Dispense:  30 g    Refill:  0    Order Specific Question:   Supervising Provider    Answer:   Lilyan Punt A [[id number]]  . HYDROcodone-acetaminophen (NORCO/VICODIN) 5-325 MG tablet    Sig: Take one tab every 4 hours as needed for severe leg pain    Dispense:  24 tablet    Refill:  0    Order Specific Question:   Supervising Provider    Answer:   Lilyan Punt A [[id number]]     Follow Up Instructions: Use hydrocodone sparingly for severe pain.  Patient to call back in 7 to 10 days if no improvement, sooner if worse.  If pain persist consider starting gabapentin. Use triamcinolone sparingly no more than 2 weeks at a time to give her skin a break.   I discussed the assessment and treatment plan with the patient. The patient was provided an opportunity to ask questions and all were answered. The patient agreed with the plan and demonstrated an understanding of the instructions.   The patient was advised to call back or seek an in-person evaluation if the symptoms worsen or if the condition fails to improve as anticipated.  I provided 15 minutes of non-face-to-face time during this encounter.      Review of Systems     Objective:   Physical Exam        Assessment & Plan:

## 2019-12-09 ENCOUNTER — Encounter: Payer: Self-pay | Admitting: Nurse Practitioner

## 2020-02-05 ENCOUNTER — Other Ambulatory Visit: Payer: Self-pay | Admitting: Family Medicine

## 2020-05-07 ENCOUNTER — Other Ambulatory Visit: Payer: Self-pay | Admitting: Nurse Practitioner

## 2020-06-11 ENCOUNTER — Other Ambulatory Visit: Payer: Self-pay | Admitting: Family Medicine

## 2020-06-11 ENCOUNTER — Other Ambulatory Visit: Payer: Self-pay | Admitting: Nurse Practitioner

## 2020-06-11 DIAGNOSIS — Z79899 Other long term (current) drug therapy: Secondary | ICD-10-CM | POA: Diagnosis not present

## 2020-06-11 DIAGNOSIS — G35 Multiple sclerosis: Secondary | ICD-10-CM | POA: Diagnosis not present

## 2020-07-03 ENCOUNTER — Telehealth: Payer: Self-pay | Admitting: Family Medicine

## 2020-07-03 DIAGNOSIS — Z13220 Encounter for screening for lipoid disorders: Secondary | ICD-10-CM

## 2020-07-03 DIAGNOSIS — R5383 Other fatigue: Secondary | ICD-10-CM

## 2020-07-03 DIAGNOSIS — Z1329 Encounter for screening for other suspected endocrine disorder: Secondary | ICD-10-CM

## 2020-07-03 DIAGNOSIS — Z1321 Encounter for screening for nutritional disorder: Secondary | ICD-10-CM

## 2020-07-03 NOTE — Telephone Encounter (Signed)
Pt has cpe scheduled 9/24 with Eber Jones and would like lab work ordered.   Also stated her doctor from [facility] wants her to ask PCP about started B12 shots

## 2020-07-05 NOTE — Telephone Encounter (Signed)
Lipid CMP TSH Vitamin D B12  Thanks!

## 2020-07-05 NOTE — Telephone Encounter (Signed)
Lab orders placed. Left message to return call  

## 2020-07-05 NOTE — Telephone Encounter (Signed)
Let pt know labs was ordered

## 2020-08-02 ENCOUNTER — Other Ambulatory Visit: Payer: Self-pay

## 2020-08-02 ENCOUNTER — Ambulatory Visit (INDEPENDENT_AMBULATORY_CARE_PROVIDER_SITE_OTHER): Payer: Medicare Other | Admitting: Nurse Practitioner

## 2020-08-02 ENCOUNTER — Encounter: Payer: Self-pay | Admitting: Nurse Practitioner

## 2020-08-02 VITALS — BP 122/82 | Temp 97.5°F | Ht 66.0 in | Wt 182.6 lb

## 2020-08-02 DIAGNOSIS — Z01419 Encounter for gynecological examination (general) (routine) without abnormal findings: Secondary | ICD-10-CM

## 2020-08-02 DIAGNOSIS — F418 Other specified anxiety disorders: Secondary | ICD-10-CM

## 2020-08-02 DIAGNOSIS — Z23 Encounter for immunization: Secondary | ICD-10-CM

## 2020-08-02 DIAGNOSIS — E559 Vitamin D deficiency, unspecified: Secondary | ICD-10-CM

## 2020-08-02 DIAGNOSIS — Z0000 Encounter for general adult medical examination without abnormal findings: Secondary | ICD-10-CM | POA: Diagnosis not present

## 2020-08-02 DIAGNOSIS — G35 Multiple sclerosis: Secondary | ICD-10-CM | POA: Diagnosis not present

## 2020-08-02 DIAGNOSIS — E538 Deficiency of other specified B group vitamins: Secondary | ICD-10-CM

## 2020-08-02 MED ORDER — CYANOCOBALAMIN 1000 MCG/ML IJ SOLN
1000.0000 ug | Freq: Once | INTRAMUSCULAR | Status: AC
  Administered 2020-08-02: 1000 ug via INTRAMUSCULAR

## 2020-08-02 MED ORDER — HYDROCODONE-ACETAMINOPHEN 5-325 MG PO TABS
ORAL_TABLET | ORAL | 0 refills | Status: DC
Start: 2020-08-02 — End: 2021-05-16

## 2020-08-02 MED ORDER — ALPRAZOLAM 0.5 MG PO TABS: 0.5000 mg | ORAL_TABLET | Freq: Every evening | ORAL | 0 refills | Status: DC | PRN

## 2020-08-02 NOTE — Progress Notes (Signed)
Subjective:    Subjective   Patient ID: Stacey Kim, female    DOB: Sep 27, 1976, 44 y.o.   MRN: [id number]  HPI The patient presents in office today for a wellness visit. She has been diagnosed with MS in 1995 and in August of this year, she resumed care via neurology services. Per neurology notes MRI and PT were ordered. Patient reports she has not been contacted for scheduling of these services to date. B12 (106) and Vit D (19) deficiency were noted at neurology visit. Patient was started on OTC Vit D and instructed to follow up with our office for B12 injections.Baclofen and Amprya were initiated at time of neurology visit. Otherwise, patient reports fair health. She is single and lives with her mother and adult child. She walks with a cane and uses a brace to support her left leg. She reports intermittent pain to her lower extremities for which she takes Naproxen or Norco to manage. She reports adequate sleep with the use of trazodone and an occassional xanax PRN. She is post premature menopause and denies any further bleeding since starting HRT. Pap smear due next year. She receives routine eye and dental care and has been fully vaccinated for COVID 19. Her last mammogram was 11/2018. She denies familial history of colon cancer.     A review of their health history was completed. A review of medications was also completed.  Any needed refills: Norco; Xanax(uses both rarely and not at the same time)  Eating habits: Fairly good, daughter cooks meals at home  Falls/  MVA accidents in past few months: none  Regular exercise: walks with daughter 30 mins every other day about   Specialist pt sees on regular basis: Neurologist  Preventative health issues were discussed.   Additional concerns: chronic leg and back pain   Review of Systems  Constitutional: Negative for chills, fever, malaise/fatigue and weight loss.  HENT: Negative for congestion, hearing loss, sinus  pain and tinnitus.   Eyes: Negative for blurred vision.  Respiratory: Negative for cough, shortness of breath and wheezing.   Cardiovascular: Negative for chest pain and palpitations.  Gastrointestinal: Negative for abdominal pain, constipation, diarrhea, heartburn, nausea and vomiting.  Genitourinary: Negative for dysuria and frequency.  Musculoskeletal: Positive for back pain.       Leg pain  Neurological: Positive for weakness. Negative for dizziness, tingling, tremors, sensory change and headaches.       Weakness left leg  Psychiatric/Behavioral: Negative for depression, substance abuse and suicidal ideas. The patient is not nervous/anxious and does not have insomnia.    Depression screen [facility] 2/9 08/02/2020 08/02/2020 10/06/2017  Decreased Interest 0 0 2  Down, Depressed, Hopeless 1 0 3  PHQ - 2 Score 1 0 5  Altered sleeping 2 - 3  Tired, decreased energy 2 - 1  Change in appetite 0 - 0  Feeling bad or failure about yourself  1 - 0  Trouble concentrating 1 - 1  Moving slowly or fidgety/restless 0 - 3  Suicidal thoughts 0 - 0  PHQ-9 Score 7 - 13  Difficult doing work/chores Somewhat difficult - Somewhat difficult       Objective:   Vitals:   08/02/20 1356  BP: 122/82  Temp: (!) 97.5 F (36.4 C)   Physical Exam Constitutional:      General: She is not in acute distress.    Appearance: Normal appearance. She is normal weight. She is not ill-appearing.  HENT:  Head: Normocephalic.  Neck:     Thyroid: No thyroid mass, thyromegaly or thyroid tenderness.     Comments: Thyroid non tender. No mass no goiter Cardiovascular:     Rate and Rhythm: Normal rate and regular rhythm.     Heart sounds: Normal heart sounds. No murmur heard.  No gallop.   Pulmonary:     Effort: Pulmonary effort is normal. No respiratory distress.     Breath sounds: Normal breath sounds. No wheezing or rhonchi.  Chest:     Breasts:        Right: No swelling, inverted nipple, mass, nipple  discharge, skin change or tenderness.        Left: No swelling, inverted nipple, mass, nipple discharge, skin change or tenderness.  Abdominal:     Palpations: Abdomen is soft. There is no mass.     Tenderness: There is no abdominal tenderness. There is no guarding.  Genitourinary:    Vagina: Normal. No vaginal discharge, tenderness or bleeding.     Cervix: No cervical motion tenderness, discharge or erythema.     Uterus: Normal. Not enlarged and not tender.      Comments: External GU: no rashes or lesions. Vagina: no discharge. Cervix normal in appearance. Bimanual exam: no tenderness or obvious masses.  Musculoskeletal:        General: No swelling, tenderness or signs of injury.     Cervical back: Normal range of motion.  Lymphadenopathy:     Upper Body:     Right upper body: No supraclavicular, axillary or pectoral adenopathy.     Left upper body: No supraclavicular, axillary or pectoral adenopathy.  Skin:    General: Skin is warm and dry.  Neurological:     Mental Status: She is alert and oriented to person, place, and time.     Motor: Weakness present.     Gait: Gait abnormal.     Comments: Left leg weakness, cane for ambulation  Psychiatric:        Mood and Affect: Mood normal.        Behavior: Behavior normal.        Thought Content: Thought content normal.        Judgment: Judgment normal.           Assessment & Plan:   Problem List Items Addressed This Visit      Nervous and Auditory   Multiple sclerosis (HCC) (Chronic)   Relevant Medications   dalfampridine 10 MG TB12     Other   Anxiety with depression   Relevant Medications   ALPRAZolam (XANAX) 0.5 MG tablet   Vitamin B12 deficiency   Vitamin D deficiency    Other Visit Diagnoses    Well woman exam with routine gynecological exam    -  Primary   Need for vaccination       Relevant Orders   Flu Vaccine QUAD 36+ mos IM (Completed)       Meds ordered this encounter  Medications  .  cyanocobalamin ((VITAMIN B-12)) injection 1,000 mcg  . ALPRAZolam (XANAX) 0.5 MG tablet    Sig: Take 1 tablet (0.5 mg total) by mouth at bedtime as needed for sleep.    Dispense:  30 tablet    Refill:  0    Order Specific Question:   Supervising Provider    Answer:   Lilyan Punt A [[id number]]  . HYDROcodone-acetaminophen (NORCO/VICODIN) 5-325 MG tablet    Sig: Take one tab every 4 hours as needed for  severe leg pain    Dispense:  24 tablet    Refill:  0    Order Specific Question:   Supervising Provider    Answer:   Lilyan Punt A [[id number]]  . Vitamin D, Ergocalciferol, (DRISDOL) 1.25 MG (50000 UNIT) CAPS capsule    Sig: Take 1 capsule (50,000 Units total) by mouth every 7 (seven) days.    Dispense:  12 capsule    Refill:  0    Order Specific Question:   Supervising Provider    Answer:   Lilyan Punt A [[id number]]     Vitamin B sq injections monthly x 3 months.  Repeat Vit B level in 3 months Restart weekly vitamin D Rx; OTC once this is complete.  Follow up with Neurology for MRI and PT orders Flu shot and B12 administered today Continue Hydrocodone for severe leg pain Continue Xanax for sleep  Patient understands not to take them within 4 hours of each other. Continue to use very sparingly.  Return in about 1 month (around 09/01/2020) for nurse visit for B12. Physical in one year.

## 2020-08-02 NOTE — Progress Notes (Signed)
° °  Subjective:    Patient ID: Stacey Kim, female    DOB: [birth year], 44 y.o.   MRN: [id number]  HPI  The patient comes in today for a wellness visit.    A review of their health history was completed.  A review of medications was also completed.  Any needed refills; no  Eating habits: eating good  Falls/  MVA accidents in past few months: none  Regular exercise: try to walk  Specialist pt sees on regular basis: MS specialist  Preventative health issues were discussed.   Additional concerns: back pain   Review of Systems     Objective:   Physical Exam        Assessment & Plan:

## 2020-08-04 ENCOUNTER — Encounter: Payer: Self-pay | Admitting: Nurse Practitioner

## 2020-08-04 DIAGNOSIS — E538 Deficiency of other specified B group vitamins: Secondary | ICD-10-CM | POA: Insufficient documentation

## 2020-08-04 DIAGNOSIS — E559 Vitamin D deficiency, unspecified: Secondary | ICD-10-CM | POA: Insufficient documentation

## 2020-08-04 MED ORDER — VITAMIN D (ERGOCALCIFEROL) 1.25 MG (50000 UNIT) PO CAPS: 50000.0000 [IU] | ORAL_CAPSULE | ORAL | 0 refills | Status: DC

## 2020-09-06 ENCOUNTER — Other Ambulatory Visit (INDEPENDENT_AMBULATORY_CARE_PROVIDER_SITE_OTHER): Payer: Medicare Other | Admitting: *Deleted

## 2020-09-06 ENCOUNTER — Other Ambulatory Visit: Payer: Self-pay

## 2020-09-06 DIAGNOSIS — E538 Deficiency of other specified B group vitamins: Secondary | ICD-10-CM

## 2020-09-06 MED ORDER — CYANOCOBALAMIN 1000 MCG/ML IJ SOLN
1000.0000 ug | Freq: Once | INTRAMUSCULAR | Status: AC
  Administered 2020-09-06: 1000 ug via INTRAMUSCULAR

## 2020-10-24 ENCOUNTER — Other Ambulatory Visit: Payer: Self-pay | Admitting: Nurse Practitioner

## 2020-11-09 ENCOUNTER — Other Ambulatory Visit: Payer: Self-pay | Admitting: Nurse Practitioner

## 2020-12-20 ENCOUNTER — Encounter: Payer: Self-pay | Admitting: Nurse Practitioner

## 2020-12-20 ENCOUNTER — Other Ambulatory Visit: Payer: Self-pay

## 2020-12-20 ENCOUNTER — Ambulatory Visit (INDEPENDENT_AMBULATORY_CARE_PROVIDER_SITE_OTHER): Payer: Medicare HMO | Admitting: Nurse Practitioner

## 2020-12-20 VITALS — BP 127/85 | HR 64 | Temp 97.7°F | Wt 185.8 lb

## 2020-12-20 DIAGNOSIS — G35 Multiple sclerosis: Secondary | ICD-10-CM | POA: Diagnosis not present

## 2020-12-20 DIAGNOSIS — G4701 Insomnia due to medical condition: Secondary | ICD-10-CM | POA: Diagnosis not present

## 2020-12-20 DIAGNOSIS — R32 Unspecified urinary incontinence: Secondary | ICD-10-CM

## 2020-12-20 MED ORDER — ALPRAZOLAM 0.5 MG PO TABS: 0.5000 mg | ORAL_TABLET | Freq: Every evening | ORAL | 2 refills | Status: DC | PRN

## 2020-12-20 NOTE — Progress Notes (Signed)
   Subjective:    Patient ID: Stacey Kim, female    DOB: 1976-07-10, 45 y.o.   MRN: [id number]  HPI Pt here to talk to provider about a MRI. Pt doctors at [facility] is wanting her to have MRI to check on MS. Patient needs MRI to reevaluate her MS. Has had urinary incontinence and wearing a diaper for about 2 months. Has had 2 visits with neurologist at [facility] who wants MRI before deciding on therapy. Has been out of Betaseron for about a month.  Lexapro is working well but having issues with sleep. Having fatigue and sleepiness during the day due to lack of sleep at night. Low dose Xanax at bedtime works well allowing her to sleep all night.    Review of Systems     Objective:   Physical Exam NAD. Alert, oriented. Cheerful affect. Speech slightly slurred but communicating without difficulty. Making good eye contact. Dressed appropriately. Lungs clear. Heart RRR.  Vitals:   12/20/20 1509  BP: 127/85  Pulse: 64  Temp: 97.7 F (36.5 C)       Assessment & Plan:   Problem List Items Addressed This Visit      Nervous and Auditory   Multiple sclerosis (HCC) - Primary (Chronic)   Relevant Orders   MR Brain W Wo Contrast     Other   Insomnia due to medical condition    Other Visit Diagnoses    Urinary incontinence, unspecified type       Relevant Orders   MR Brain W Wo Contrast     Schedule MRI of the brain as soon as possible. Follow up with neurologist after this to discuss therapy for MS. Meds ordered this encounter  Medications  . ALPRAZolam (XANAX) 0.5 MG tablet    Sig: Take 1 tablet (0.5 mg total) by mouth at bedtime as needed for sleep.    Dispense:  30 tablet    Refill:  2    Order Specific Question:   Supervising Provider    Answer:   Lilyan Punt A [[id number]]   Low dose Xanax at bedtime for sleep. Continue other medications as directed. Follow up for wellness exam and yearly labs. Call back sooner if needed. Return in about 6 months (around 06/19/2021).

## 2020-12-21 ENCOUNTER — Encounter: Payer: Self-pay | Admitting: Nurse Practitioner

## 2020-12-21 DIAGNOSIS — G4701 Insomnia due to medical condition: Secondary | ICD-10-CM | POA: Insufficient documentation

## 2020-12-31 ENCOUNTER — Telehealth: Payer: Self-pay | Admitting: Family Medicine

## 2021-01-13 ENCOUNTER — Ambulatory Visit (HOSPITAL_COMMUNITY)
Admission: RE | Admit: 2021-01-13 | Discharge: 2021-01-13 | Disposition: A | Discharge status: Home / Self Care | Payer: Medicare HMO | Source: Ambulatory Visit | Attending: Nurse Practitioner | Admitting: Nurse Practitioner

## 2021-01-13 ENCOUNTER — Other Ambulatory Visit: Payer: Self-pay

## 2021-01-13 DIAGNOSIS — G35 Multiple sclerosis: Secondary | ICD-10-CM | POA: Insufficient documentation

## 2021-01-13 DIAGNOSIS — R32 Unspecified urinary incontinence: Secondary | ICD-10-CM | POA: Insufficient documentation

## 2021-01-13 MED ORDER — GADOBUTROL 1 MMOL/ML IV SOLN
7.0000 mL | Freq: Once | INTRAVENOUS | Status: AC | PRN
  Administered 2021-01-13: 7 mL via INTRAVENOUS

## 2021-01-27 ENCOUNTER — Other Ambulatory Visit: Payer: Self-pay | Admitting: Nurse Practitioner

## 2021-04-28 ENCOUNTER — Other Ambulatory Visit: Payer: Self-pay | Admitting: Nurse Practitioner

## 2021-05-06 ENCOUNTER — Other Ambulatory Visit: Payer: Self-pay | Admitting: Nurse Practitioner

## 2021-05-06 ENCOUNTER — Other Ambulatory Visit: Payer: Self-pay | Admitting: Family Medicine

## 2021-05-16 ENCOUNTER — Telehealth: Payer: Medicare HMO | Admitting: Family Medicine

## 2021-05-16 ENCOUNTER — Other Ambulatory Visit: Payer: Self-pay | Admitting: Nurse Practitioner

## 2021-05-16 MED ORDER — HYDROCODONE-ACETAMINOPHEN 5-325 MG PO TABS: ORAL_TABLET | ORAL | 0 refills | Status: AC

## 2021-05-16 NOTE — Telephone Encounter (Signed)
Patient called requesting refill on her hydrocodone called into [facility].  CB# [phone]

## 2021-05-16 NOTE — Telephone Encounter (Signed)
Done

## 2021-05-16 NOTE — Telephone Encounter (Signed)
Last seen 12/20/20 for back pain.

## 2021-07-18 IMAGING — MR MR HEAD WO/W CM
16 of 18 series · 40 of 48 positions shown · IV contrast (gadavist)
Comparison: Report from examination 03/28/2003

CLINICAL DATA: Follow-up multiple sclerosis.

EXAM:
MRI HEAD WITHOUT AND WITH CONTRAST
TECHNIQUE: Multiplanar, multiecho pulse sequences of the brain and surrounding
structures were obtained without and with intravenous contrast.
CONTRAST:  7mL GADAVIST GADOBUTROL 1 MMOL/ML IV SOLN

[Series 5: DWI · axial · 4.0mm · 0.88mm/px · z∈[-88,+50]mm · 4 of 36 slices shown (1 of 6)]
[im 1/36]
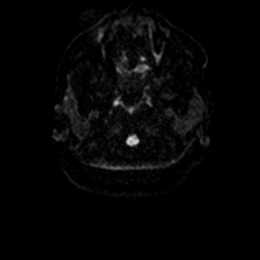
[im 12/36]
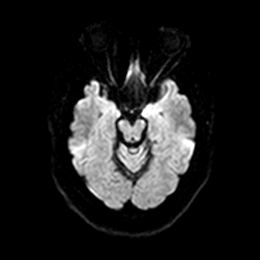
[im 24/36]
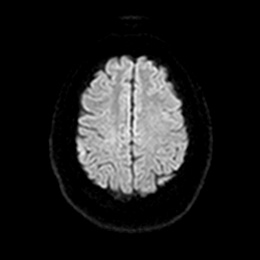
[im 36/36]
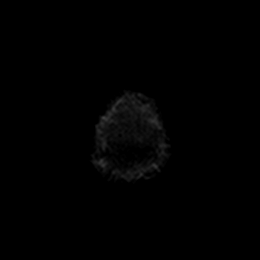

[Series 5: DWI · axial · 4.0mm · 0.88mm/px · z∈[-88,+50]mm · 3 of 36 slices shown (2 of 6)]
[im 1/36]
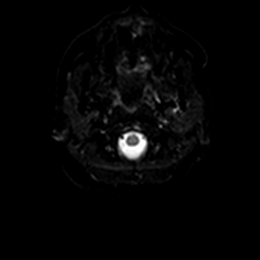
[im 18/36]
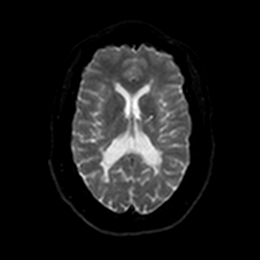
[im 36/36]
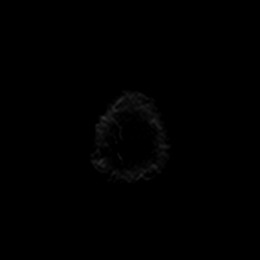

[Series 6: DWI · axial · 4.0mm · 0.88mm/px · z∈[-88,+50]mm · 3 of 36 slices shown (3 of 6)]
[im 1/36]
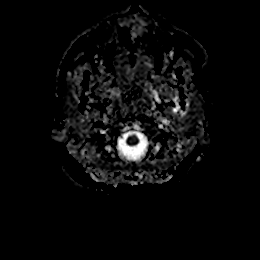
[im 18/36]
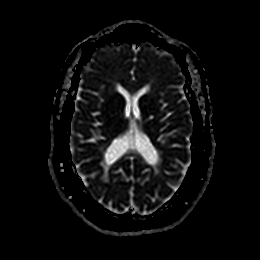
[im 36/36]
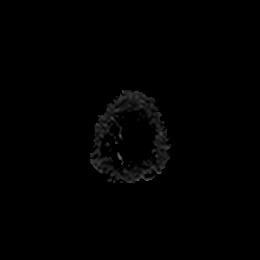

[Series 7: DWI · coronal · 5.0mm · 0.88mm/px · 2 of 28 slices shown (4 of 6)]
[im 1/28]
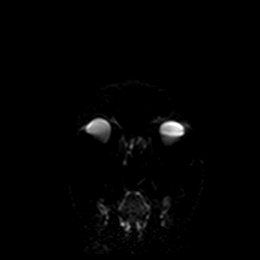
[im 28/28]
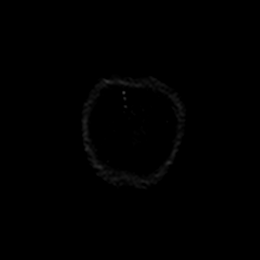

[Series 7: DWI · coronal · 5.0mm · 0.88mm/px · 2 of 28 slices shown (5 of 6)]
[im 1/28]
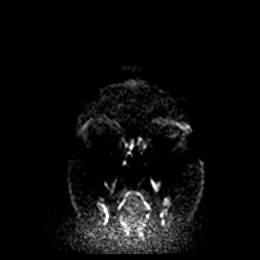
[im 28/28]
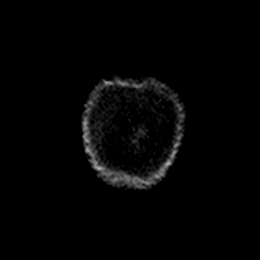

[Series 8: DWI · coronal · 5.0mm · 0.88mm/px · 2 of 28 slices shown (6 of 6)]
[im 1/28]
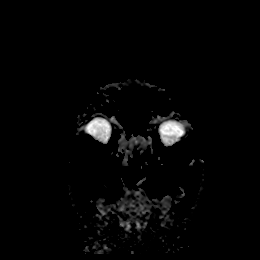
[im 28/28]
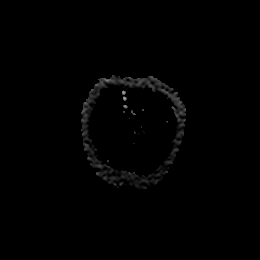

[Series 9: T1 · sagittal · 5.0mm · 0.75mm/px · 1 of 19 slices shown]
[im 1/19]
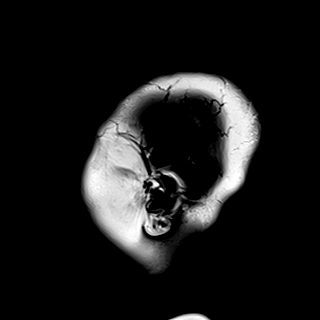

[Series 10: T2 · axial · 5.0mm · 0.72mm/px · 1 of 20 slices shown]
[im 1/20]
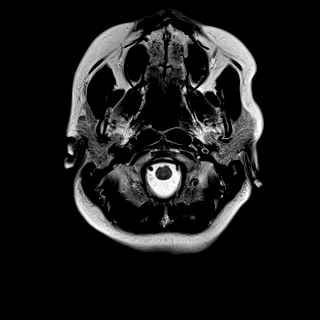

[Series 11: mag_images · axial · 3.0mm · 0.90mm/px · z∈[-109,+65]mm · 4 of 60 slices shown]
[im 1/60]
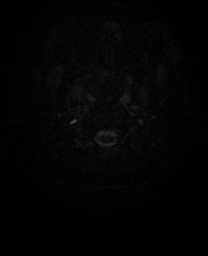
[im 20/60]
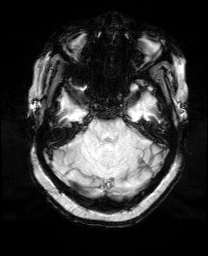
[im 40/60]
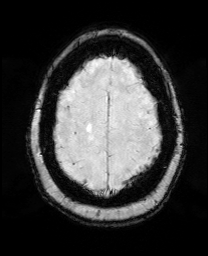
[im 60/60]
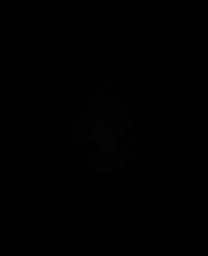

[Series 12: pha_images · axial · 3.0mm · 0.90mm/px · z∈[-109,+65]mm · 4 of 60 slices shown]
[im 1/60]
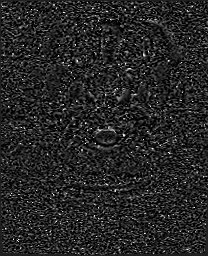
[im 20/60]
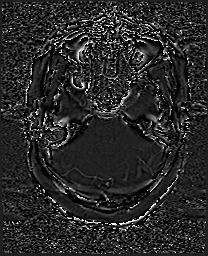
[im 40/60]
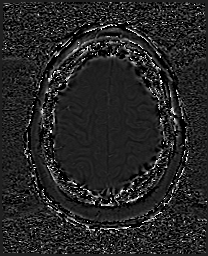
[im 60/60]
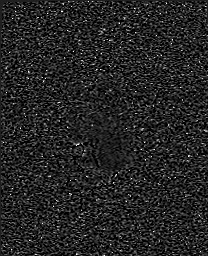

[Series 13: swi_images · axial · 3.0mm · 0.90mm/px · z∈[-109,+65]mm · 4 of 60 slices shown]
[im 1/60]
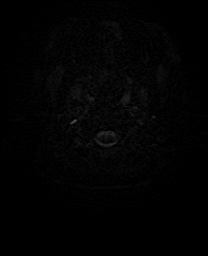
[im 20/60]
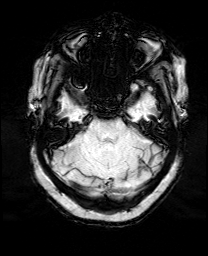
[im 40/60]
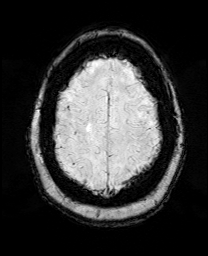
[im 60/60]
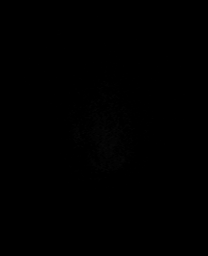

[Series 15: FLAIR · axial · 3.0mm · 0.45mm/px · z∈[-80,+36]mm · 3 of 40 slices shown (1 of 2)]
[im 1/40]
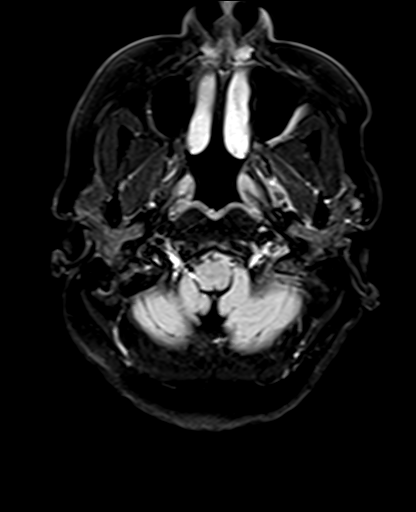
[im 20/40]
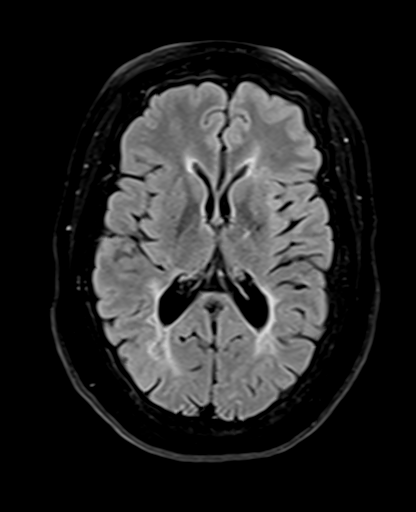
[im 40/40]
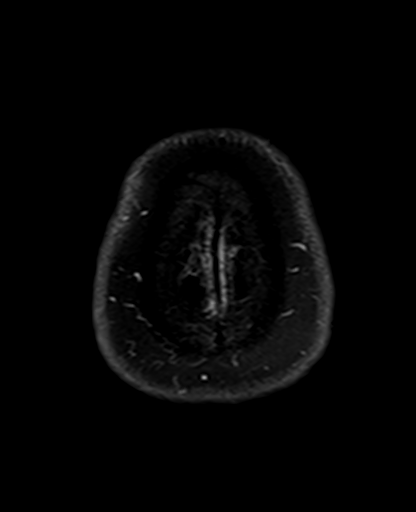

[Series 16: FLAIR · sagittal · 5.0mm · 0.94mm/px · 2 of 25 slices shown (2 of 2)]
[im 1/25]
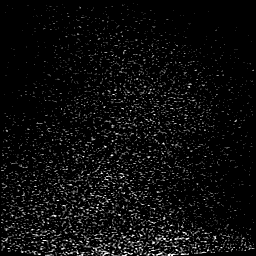
[im 25/25]
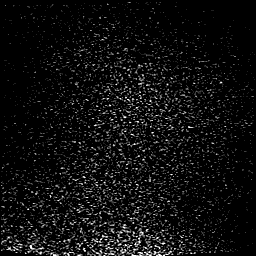

[Series 18: T2 post-contrast · coronal · 5.0mm · 0.72mm/px · 2 of 28 slices shown]
[im 1/28]
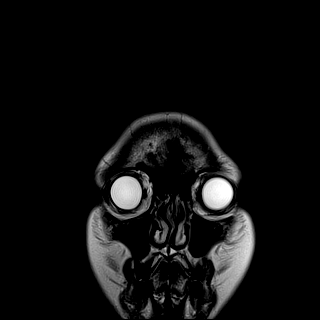
[im 28/28]
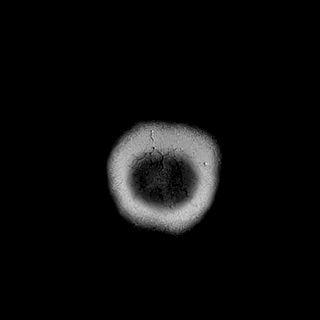

[Series 20: T1 post-contrast · coronal · 5.0mm · 0.34mm/px · 2 of 28 slices shown (1 of 2)]
[im 1/28]
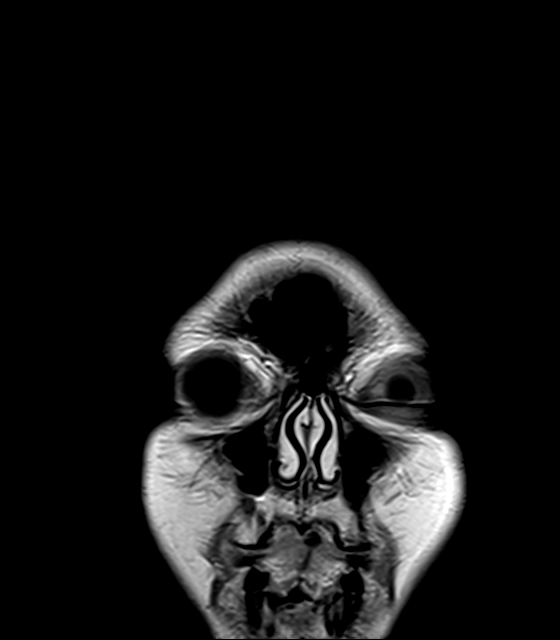
[im 28/28]
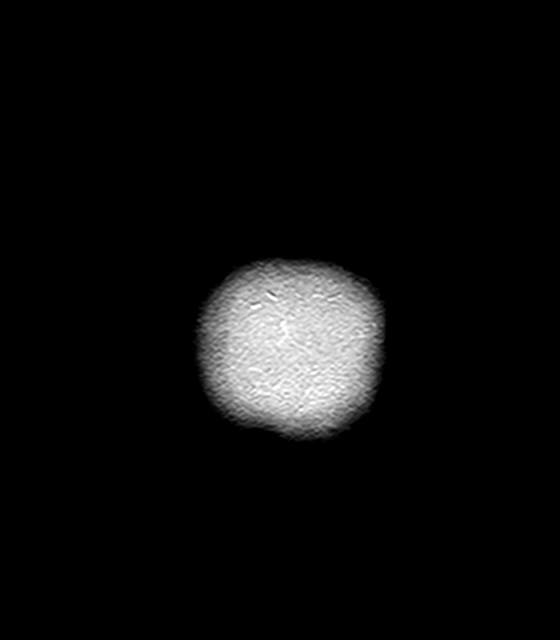

[Series 21: T1 post-contrast · sagittal · 5.0mm · 0.72mm/px · 1 of 19 slices shown (2 of 2)]
[im 1/19]
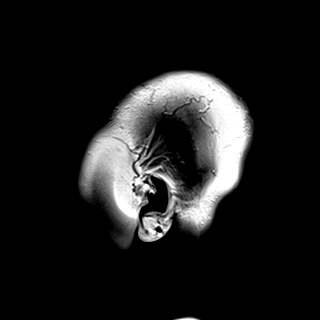

[40 of 48 positions shown; findings below may reference images not displayed]

FINDINGS: Brain: Diffusion imaging does not show any acute or subacute
infarction or other cause of restricted diffusion. No focal
abnormality affects the brainstem. There are numerous foci of
chronic appearing abnormal T2 and FLAIR signal within the cerebellar
peduncles and the cerebral hemispheres. Extensive deep white matter
and some sub cortical white matter lesions are seen throughout the
cerebral hemispheres, consistent with the chronic history of
multiple sclerosis. One could not rule out the possibility of some
coexistent small-vessel disease. Especially, there appears to be an
old small vessel infarction in the left basal ganglia and radiating
white matter tracts. As above, none of these show restricted
diffusion. None show contrast enhancement. Therefore, there are no
detectable active lesions. No sign of a large vessel territory
insult. No mass, acute hemorrhage, hydrocephalus or extra-axial
collection. There is some hemosiderin deposition in the left basal
ganglia, possibly in relation to an old small vessel infarction.

Vascular: Major vessels at the base of the brain show flow.

Skull and upper cervical spine: Negative

Sinuses/Orbits: Clear/normal

Other: None
IMPRESSION: Findings consistent with chronic multiple sclerosis/demyelinating
disease. Extensive involvement of the cerebellum and cerebral
hemispheres. No lesions show restricted diffusion or contrast
enhancement to suggest recent or active demyelination. Additionally
in this case, there could also be coexistent small-vessel disease,
as a few of the abnormalities including abnormality in the left
basal ganglia and radiating white matter tracts resemble old small
vessel infarctions more than typical demyelinating disease.

## 2021-07-30 ENCOUNTER — Telehealth: Payer: Self-pay | Admitting: Family Medicine

## 2021-07-30 NOTE — Telephone Encounter (Signed)
Left message for patient to call back and schedule Medicare Annual Wellness Visit (AWV) in office.  ° °If unable to come into the office for AWV,  please offer to do virtually or by telephone. ° °Last AWV:  08/02/2020 ° °Please schedule at anytime with [facility]. ° °40 minute appointment ° °Any questions, please contact me at [phone]  °  °  °

## 2021-09-01 ENCOUNTER — Other Ambulatory Visit: Payer: Self-pay | Admitting: Nurse Practitioner

## 2021-09-26 DIAGNOSIS — D519 Vitamin B12 deficiency anemia, unspecified: Secondary | ICD-10-CM | POA: Insufficient documentation

## 2021-10-28 ENCOUNTER — Telehealth: Payer: Self-pay | Admitting: Family Medicine

## 2021-10-28 NOTE — Telephone Encounter (Signed)
Left message for patient to call back and schedule Medicare Annual Wellness Visit (AWV) in office.   If unable to come into the office for AWV,  please offer to do virtually or by telephone.  Last AWV:  08/02/2020  Please schedule at anytime with [facility].  40 minute appointment  Any questions, please contact me at [phone]

## 2022-09-28 ENCOUNTER — Encounter: Payer: Medicare HMO | Admitting: Nurse Practitioner

## 2022-10-05 ENCOUNTER — Encounter: Payer: Self-pay | Admitting: Nurse Practitioner

## 2022-10-05 ENCOUNTER — Ambulatory Visit (INDEPENDENT_AMBULATORY_CARE_PROVIDER_SITE_OTHER): Payer: Medicare Other | Admitting: Nurse Practitioner

## 2022-10-05 VITALS — BP 143/93 | HR 98 | Temp 98.0°F | Ht 66.0 in | Wt 163.4 lb

## 2022-10-05 DIAGNOSIS — Z1211 Encounter for screening for malignant neoplasm of colon: Secondary | ICD-10-CM | POA: Diagnosis not present

## 2022-10-05 DIAGNOSIS — Z0000 Encounter for general adult medical examination without abnormal findings: Secondary | ICD-10-CM | POA: Diagnosis not present

## 2022-10-05 NOTE — Progress Notes (Signed)
   Subjective:    Patient ID: Stacey Kim, female    DOB: [birth year], 46 y.o.   MRN: [id number]  HPI AWV- Annual Wellness Visit  The patient was seen for their annual wellness visit. The patient's past medical history, surgical history, and family history were reviewed. Pertinent vaccines were reviewed ( tetanus, pneumonia, shingles, flu) The patient's medication list was reviewed and updated.  The height and weight were entered.  BMI recorded in electronic record elsewhere  Cognitive screening was completed. Outcome of Mini - Cog: Pass   Falls /depression screening electronically recorded within record elsewhere  Current tobacco usage: none (All patients who use tobacco were given written and verbal information on quitting)  Recent listing of emergency department/hospitalizations over the past year were reviewed.  current specialist the patient sees on a regular basis: MS Provider    Medicare annual wellness visit patient questionnaire was reviewed.  A written screening schedule for the patient for the next 5-10 years was given. Appropriate discussion of followup regarding next visit was discussed.      Review of Systems  All other systems reviewed and are negative.      Objective:   Physical Exam Vitals reviewed.  Constitutional:      General: She is not in acute distress.    Appearance: Normal appearance. She is normal weight. She is not ill-appearing, toxic-appearing or diaphoretic.  Neurological:     Mental Status: She is alert.  Psychiatric:        Mood and Affect: Mood normal.        Behavior: Behavior normal.           Assessment & Plan:   1. Medicare annual wellness visit, initial Adult wellness-complete.wellness physical was conducted today. Importance of diet and exercise were discussed in detail.  Importance of stress reduction and healthy living were discussed.  In addition to this a discussion regarding safety was also covered.  We also  reviewed over immunizations and gave recommendations regarding current immunization needed for age.   In addition to this additional areas were also touched on including: Preventative health exams needed:  Cologaurd ordered today Flu vaccine completed. Patient to bring in vaccine record COVID vaccine #3. Patient to schedule PAP over due. Patient to schedule with Mrs. Eber Jones  Patient was advised yearly wellness exam   2. Colon cancer screening -Cologaurd

## 2022-10-05 NOTE — Patient Instructions (Addendum)
Thank you for coming for your annual wellness visit.  Please follow through on any advice that was given to you by today's visit. Remember to maintain compliance with your medications as discussed today.  Also remember it is important to eat a healthy diet and to stay physically active on a daily basis.  Please follow through with any testing or recommended followup office visits as was discussed today. You are due the following test coming up:  Cologaurd ordered today Flu vaccine completed. Patient to bring in vaccine record COVID vaccine #3. Patient to schedule PAP over due. Patient to schedule with Mrs. Eber Jones       Finally remembered that the annual wellness visit does not take the place of regularly scheduled office visits  chronic health problems such as hypertension/diabetes/cholesterol visits.

## 2022-11-07 DIAGNOSIS — Z1211 Encounter for screening for malignant neoplasm of colon: Secondary | ICD-10-CM | POA: Diagnosis not present

## 2022-11-13 ENCOUNTER — Ambulatory Visit (INDEPENDENT_AMBULATORY_CARE_PROVIDER_SITE_OTHER): Payer: Medicare Other | Admitting: Nurse Practitioner

## 2022-11-13 VITALS — BP 128/82 | HR 70 | Temp 98.0°F | Ht 65.0 in | Wt 162.6 lb

## 2022-11-13 DIAGNOSIS — Z01411 Encounter for gynecological examination (general) (routine) with abnormal findings: Secondary | ICD-10-CM | POA: Diagnosis not present

## 2022-11-13 DIAGNOSIS — Z124 Encounter for screening for malignant neoplasm of cervix: Secondary | ICD-10-CM | POA: Diagnosis not present

## 2022-11-13 DIAGNOSIS — D519 Vitamin B12 deficiency anemia, unspecified: Secondary | ICD-10-CM

## 2022-11-13 DIAGNOSIS — Z1151 Encounter for screening for human papillomavirus (HPV): Secondary | ICD-10-CM | POA: Diagnosis not present

## 2022-11-13 DIAGNOSIS — R5382 Chronic fatigue, unspecified: Secondary | ICD-10-CM

## 2022-11-13 DIAGNOSIS — Z01419 Encounter for gynecological examination (general) (routine) without abnormal findings: Secondary | ICD-10-CM

## 2022-11-13 DIAGNOSIS — E559 Vitamin D deficiency, unspecified: Secondary | ICD-10-CM | POA: Diagnosis not present

## 2022-11-13 NOTE — Progress Notes (Unsigned)
Subjective:    Patient ID: Stacey Kim, female    DOB: February 20, 1976, 47 y.o.   MRN: [id number]  HPI The patient comes in today for a wellness visit.    A review of their health history was completed.  A review of medications was also completed.  Any needed refills; no  Eating habits: patient states trying to eat healthy  Falls/  MVA accidents in past few months: no  Regular exercise: treadmill 2 x week   Specialist pt sees on regular basis: MS doctor   Preventative health issues were discussed.   Additional concerns: No concerns or issues for today   Completed Cologuard; waiting on results. Defers colonoscopy at this time. No family history of colon cancer. Regular vision and dental exams. Same female sexual partner. No menses or bleeding since her last physical.   Review of Systems  Constitutional:  Positive for fatigue. Negative for activity change and appetite change.  HENT:  Negative for sore throat and trouble swallowing.   Respiratory:  Negative for cough, chest tightness, shortness of breath and wheezing.   Cardiovascular:  Negative for chest pain.  Gastrointestinal:  Negative for abdominal distention, abdominal pain, constipation, diarrhea, nausea and vomiting.  Genitourinary:  Negative for difficulty urinating, dysuria, enuresis, frequency, genital sores, pelvic pain, urgency, vaginal bleeding and vaginal discharge.      11/13/2022    9:37 AM  Depression screen PHQ 2/9  Decreased Interest 0  Down, Depressed, Hopeless 2  PHQ - 2 Score 2  Altered sleeping 2  Tired, decreased energy 2  Change in appetite 0  Feeling bad or failure about yourself  2  Trouble concentrating 0  Moving slowly or fidgety/restless 2  Suicidal thoughts 0  PHQ-9 Score 10  Difficult doing work/chores Not difficult at all      11/13/2022    9:39 AM  GAD 7 : Generalized Anxiety Score  Nervous, Anxious, on Edge 0  Control/stop worrying 0  Worry too much - different things 0   Trouble relaxing 0  Restless 1  Easily annoyed or irritable 2  Afraid - awful might happen 0  Total GAD 7 Score 3  Anxiety Difficulty Not difficult at all         Objective:   Physical Exam Vitals and nursing note reviewed.  Constitutional:      General: She is not in acute distress.    Appearance: She is well-developed.  Neck:     Thyroid: No thyromegaly.     Trachea: No tracheal deviation.     Comments: Thyroid non tender to palpation. No mass or goiter noted.  Cardiovascular:     Rate and Rhythm: Normal rate and regular rhythm.     Heart sounds: Normal heart sounds. No murmur heard. Pulmonary:     Effort: Pulmonary effort is normal.     Breath sounds: Normal breath sounds.  Chest:  Breasts:    Right: No swelling, inverted nipple, mass, skin change or tenderness.     Left: No swelling, inverted nipple, mass, skin change or tenderness.  Abdominal:     General: There is no distension.     Palpations: Abdomen is soft.     Tenderness: There is no abdominal tenderness.  Genitourinary:    General: Normal vulva.     Labia:        Right: No rash, tenderness or lesion.        Left: No rash, tenderness or lesion.      Vagina:  No vaginal discharge, erythema, bleeding or lesions.     Cervix: No cervical motion tenderness, discharge, friability, lesion, erythema, cervical bleeding or eversion.     Comments: Bimanual exam: no tenderness or obvious masses.  Musculoskeletal:     Cervical back: Normal range of motion and neck supple.  Lymphadenopathy:     Cervical: No cervical adenopathy.     Upper Body:     Right upper body: No supraclavicular, axillary or pectoral adenopathy.     Left upper body: No supraclavicular, axillary or pectoral adenopathy.  Skin:    General: Skin is warm and dry.     Findings: No rash.  Neurological:     Mental Status: She is alert and oriented to person, place, and time.  Psychiatric:        Mood and Affect: Mood normal.        Behavior:  Behavior normal.        Thought Content: Thought content normal.        Judgment: Judgment normal.    Today's Vitals   11/13/22 0929  BP: 128/82  Pulse: 70  Temp: 98 F (36.7 C)  TempSrc: Oral  SpO2: 98%  Weight: 162 lb 9.6 oz (73.8 kg)  Height: 5\' 5"  (1.651 m)   Body mass index is 27.06 kg/m.         Assessment & Plan:   Problem List Items Addressed This Visit       Other   Anemia, vitamin B12 deficiency   Relevant Orders   Vitamin B12 (Completed)   Vitamin D deficiency   Relevant Orders   Vitamin D, 25-hydroxy (Completed)   Other Visit Diagnoses     Well woman exam    -  Primary   Relevant Orders   IGP, Aptima HPV   CBC with Differential (Completed)   CMP14+EGFR (Completed)   Lipid panel (Completed)   TSH (Completed)   Vitamin B12 (Completed)   Vitamin D, 25-hydroxy (Completed)   Screening for cervical cancer       Relevant Orders   IGP, Aptima HPV   Screening for HPV (human papillomavirus)       Relevant Orders   IGP, Aptima HPV      Defers medication for depression at this time.  Labs pending. Continue healthy lifestyle habits. Continue follow up with MS specialist. Return in about 1 year (around 11/14/2023) for physical.

## 2022-11-14 LAB — CMP14+EGFR
ALT: 11 IU/L (ref 0–32)
AST: 15 IU/L (ref 0–40)
Albumin/Globulin Ratio: 1.4 (ref 1.2–2.2)
Albumin: 4.9 g/dL (ref 3.9–4.9)
Alkaline Phosphatase: 86 IU/L (ref 44–121)
BUN/Creatinine Ratio: 8 — ABNORMAL LOW (ref 9–23)
BUN: 8 mg/dL (ref 6–24)
Bilirubin Total: 0.6 mg/dL (ref 0.0–1.2)
CO2: 18 mmol/L — ABNORMAL LOW (ref 20–29)
Calcium: 10 mg/dL (ref 8.7–10.2)
Chloride: 103 mmol/L (ref 96–106)
Creatinine, Ser: 0.99 mg/dL (ref 0.57–1.00)
Globulin, Total: 3.6 g/dL (ref 1.5–4.5)
Glucose: 101 mg/dL — ABNORMAL HIGH (ref 70–99)
Potassium: 4.2 mmol/L (ref 3.5–5.2)
Sodium: 143 mmol/L (ref 134–144)
Total Protein: 8.5 g/dL (ref 6.0–8.5)
eGFR: 71 mL/min/{1.73_m2} (ref >59)

## 2022-11-14 LAB — LIPID PANEL
Chol/HDL Ratio: 3.9 ratio (ref 0.0–4.4)
Cholesterol, Total: 236 mg/dL — ABNORMAL HIGH (ref 100–199)
HDL: 60 mg/dL (ref >39)
LDL Chol Calc (NIH): 160 mg/dL — ABNORMAL HIGH (ref 0–99)
Triglycerides: 91 mg/dL (ref 0–149)
VLDL Cholesterol Cal: 16 mg/dL (ref 5–40)

## 2022-11-14 LAB — VITAMIN D 25 HYDROXY (VIT D DEFICIENCY, FRACTURES): Vit D, 25-Hydroxy: 16.4 ng/mL — ABNORMAL LOW (ref 30.0–100.0)

## 2022-11-14 LAB — CBC WITH DIFFERENTIAL/PLATELET
Basophils Absolute: 0 10*3/uL (ref 0.0–0.2)
Basos: 1 %
EOS (ABSOLUTE): 0 10*3/uL (ref 0.0–0.4)
Eos: 0 %
Hematocrit: 37.9 % (ref 34.0–46.6)
Hemoglobin: 12.3 g/dL (ref 11.1–15.9)
Immature Grans (Abs): 0 10*3/uL (ref 0.0–0.1)
Immature Granulocytes: 0 %
Lymphocytes Absolute: 2.1 10*3/uL (ref 0.7–3.1)
Lymphs: 36 %
MCH: 26.5 pg — ABNORMAL LOW (ref 26.6–33.0)
MCHC: 32.5 g/dL (ref 31.5–35.7)
MCV: 82 fL (ref 79–97)
Monocytes Absolute: 0.2 10*3/uL (ref 0.1–0.9)
Monocytes: 4 %
Neutrophils Absolute: 3.4 10*3/uL (ref 1.4–7.0)
Neutrophils: 59 %
Platelets: 383 10*3/uL (ref 150–450)
RBC: 4.65 x10E6/uL (ref 3.77–5.28)
RDW: 12.5 % (ref 11.7–15.4)
WBC: 5.7 10*3/uL (ref 3.4–10.8)

## 2022-11-14 LAB — TSH: TSH: 1.63 u[IU]/mL (ref 0.450–4.500)

## 2022-11-14 LAB — VITAMIN B12: Vitamin B-12: 216 pg/mL — ABNORMAL LOW (ref 232–1245)

## 2022-11-15 ENCOUNTER — Encounter: Payer: Self-pay | Admitting: Nurse Practitioner

## 2022-11-15 ENCOUNTER — Other Ambulatory Visit: Payer: Self-pay | Admitting: Nurse Practitioner

## 2022-11-15 LAB — COLOGUARD: COLOGUARD: NEGATIVE

## 2022-11-15 MED ORDER — VITAMIN D (ERGOCALCIFEROL) 1.25 MG (50000 UNIT) PO CAPS: 50000.0000 [IU] | ORAL_CAPSULE | ORAL | 0 refills | Status: AC

## 2022-11-18 LAB — IGP, APTIMA HPV: HPV Aptima: NEGATIVE

## 2024-02-08 DIAGNOSIS — G35 Multiple sclerosis: Secondary | ICD-10-CM | POA: Diagnosis not present

## 2024-02-08 DIAGNOSIS — M4802 Spinal stenosis, cervical region: Secondary | ICD-10-CM | POA: Diagnosis not present

## 2024-02-08 DIAGNOSIS — M47812 Spondylosis without myelopathy or radiculopathy, cervical region: Secondary | ICD-10-CM | POA: Diagnosis not present

## 2024-02-18 ENCOUNTER — Ambulatory Visit: Payer: Medicare Other

## 2024-02-18 VITALS — Ht 65.0 in | Wt 162.0 lb

## 2024-02-18 DIAGNOSIS — Z0000 Encounter for general adult medical examination without abnormal findings: Secondary | ICD-10-CM | POA: Diagnosis not present

## 2024-02-18 NOTE — Patient Instructions (Signed)
 Stacey Kim , Thank you for taking time to come for your Medicare Wellness Visit. I appreciate your ongoing commitment to your health goals. Please review the following plan we discussed and let me know if I can assist you in the future.   Referrals/Orders/Follow-Ups/Clinician Recommendations: Aim for 30 minutes of exercise or brisk walking, 6-8 glasses of water, and 5 servings of fruits and vegetables each day.  This is a list of the screening recommended for you and due dates:  Health Maintenance  Topic Date Due   DTaP/Tdap/Td vaccine (4 - Td or Tdap) 01/29/2019   COVID-19 Vaccine (3 - 2024-25 season) 07/11/2023   Flu Shot  06/09/2024   Medicare Annual Wellness Visit  02/17/2025   Cologuard (Stool DNA test)  11/07/2025   Pap with HPV screening  11/14/2027   HIV Screening  Completed   HPV Vaccine  Aged Out   Meningitis B Vaccine  Aged Out   Colon Cancer Screening  Discontinued   Hepatitis C Screening  Discontinued    Advanced directives: (ACP Link)Information on Advanced Care Planning can be found at [facility] [place] Advance Health Care Directives Advance Health Care Directives. [email or web address]   Next Medicare Annual Wellness Visit scheduled for next year: Yes

## 2024-02-18 NOTE — Progress Notes (Signed)
 Subjective:   Stacey Kim is a 48 y.o. who presents for a Medicare Wellness preventive visit.  Visit Complete: Virtual I connected with  Herby Abraham on 02/18/24 by a audio enabled telemedicine application and verified that I am speaking with the correct person using two identifiers.  Patient Location: Home  Provider Location: Home Office  I discussed the limitations of evaluation and management by telemedicine. The patient expressed understanding and agreed to proceed.  Vital Signs: Because this visit was a virtual/telehealth visit, some criteria may be missing or patient reported. Any vitals not documented were not able to be obtained and vitals that have been documented are patient reported.  VideoDeclined- This patient declined Librarian, academic. Therefore the visit was completed with audio only.  Persons Participating in Visit: Patient.  AWV Questionnaire: No: Patient Medicare AWV questionnaire was not completed prior to this visit.  Cardiac Risk Factors include: sedentary lifestyle     Objective:    Today's Vitals   02/18/24 1448  Weight: 162 lb (73.5 kg)  Height: 5\' 5"  (1.651 m)   Body mass index is 26.96 kg/m.     02/18/2024    3:14 PM  Advanced Directives  Does Patient Have a Medical Advance Directive? No  Would patient like information on creating a medical advance directive? Yes (MAU/Ambulatory/Procedural Areas - Information given)    Current Medications (verified) Outpatient Encounter Medications as of 02/18/2024  Medication Sig   dalfampridine 10 MG TB12 Take 10 mg by mouth every 12 (twelve) hours.   HYDROcodone-acetaminophen (NORCO/VICODIN) 5-325 MG tablet Take one tab every 4 hours as needed for severe leg pain   ALPRAZolam (XANAX) 0.5 MG tablet TAKE 1 TABLET (0.5 MG TOTAL) BY MOUTH AT BEDTIME AS NEEDED FOR SLEEP. (Patient not taking: Reported on 02/18/2024)   baclofen (LIORESAL) 10 MG tablet 10 mg at bedtime.  (Patient not taking: Reported on 02/18/2024)   escitalopram (LEXAPRO) 10 MG tablet TAKE 1 TABLET BY MOUTH EVERY DAY (Patient not taking: Reported on 02/18/2024)   Interferon Beta-1b (BETASERON [place]) Inject into the skin. Every other day (Patient not taking: Reported on 02/18/2024)   JUNEL 1/20 1-20 MG-MCG tablet TAKE 1 TABLET BY MOUTH EVERY DAY (Patient not taking: Reported on 02/18/2024)   ketoconazole (NIZORAL) 2 % cream APPLY TOPICALLY TWO TIMES DAILY AS NEEDED (Patient not taking: Reported on 02/18/2024)   naproxen (NAPROSYN) 500 MG tablet TAKE 1 TABLET TWICE A DAY WITH FOOD AS NEEDED FOR PAIN (Patient not taking: Reported on 02/18/2024)   pantoprazole (PROTONIX) 40 MG tablet TAKE ONE TABLET BY MOUTH ONCE DAILY FOR ACID REFLUX *NEED OFFICE VISIT FOR FURTHER REFILLS* (Patient not taking: Reported on 02/18/2024)   traZODone (DESYREL) 100 MG tablet Take 1 tablet (100 mg total) by mouth at bedtime. (Patient not taking: Reported on 02/18/2024)   triamcinolone cream (KENALOG) 0.1 % Apply 1 application topically 2 (two) times daily. To rash prn up to 2 weeks at a time (Patient not taking: Reported on 02/18/2024)   Vitamin D, Ergocalciferol, (DRISDOL) 1.25 MG (50000 UNIT) CAPS capsule Take 1 capsule (50,000 Units total) by mouth every 7 (seven) days. (Patient not taking: Reported on 02/18/2024)   No facility-administered encounter medications on file as of 02/18/2024.    Allergies (verified) Patient has no known allergies.   History: Past Medical History:  Diagnosis Date   Multiple sclerosis (HCC) 1995   Multiple sclerosis (HCC) 1995   History reviewed. No pertinent surgical history. Family History  Problem  Relation Age of Onset   Hypertension Other    Social History   Socioeconomic History   Marital status: Single    Spouse name: Not on file   Number of children: Not on file   Years of education: Not on file   Highest education level: Not on file  Occupational History   Not on file  Tobacco Use    Smoking status: Never   Smokeless tobacco: Never  Substance and Sexual Activity   Alcohol use: No   Drug use: No   Sexual activity: Not Currently    Birth control/protection: Post-menopausal  Other Topics Concern   Not on file  Social History Narrative   Not on file   Social Drivers of Health   Financial Resource Strain: Low Risk  (02/18/2024)   Overall Financial Resource Strain (CARDIA)    Difficulty of Paying Living Expenses: Not hard at all  Food Insecurity: No Food Insecurity (02/18/2024)   Hunger Vital Sign    Worried About Running Out of Food in the Last Year: Never true    Ran Out of Food in the Last Year: Never true  Transportation Needs: No Transportation Needs (02/18/2024)   PRAPARE - Administrator, Civil Service (Medical): No    Lack of Transportation (Non-Medical): No  Physical Activity: Inactive (02/18/2024)   Exercise Vital Sign    Days of Exercise per Week: 0 days    Minutes of Exercise per Session: 0 min  Stress: Stress Concern Present (02/18/2024)   [employer] of Occupational Health - Occupational Stress Questionnaire    Feeling of Stress : To some extent  Social Connections: Moderately Isolated (02/18/2024)   Social Connection and Isolation Panel [NHANES]    Frequency of Communication with Friends and Family: More than three times a week    Frequency of Social Gatherings with Friends and Family: Three times a week    Attends Religious Services: More than 4 times per year    Active Member of Clubs or Organizations: No    Attends Banker Meetings: Never    Marital Status: Never married    Tobacco Counseling Counseling given: Not Answered    Clinical Intake:  Pre-visit preparation completed: Yes  Pain : No/denies pain     Diabetes: No  No results found for: "HGBA1C"   How often do you need to have someone help you when you read instructions, pamphlets, or other written materials from your doctor or pharmacy?: 1 -  Never  Interpreter Needed?: No  Information entered by :: Kandis Fantasia LPN   Activities of Daily Living     02/18/2024    3:14 PM  In your present state of health, do you have any difficulty performing the following activities:  Hearing? 0  Vision? 0  Difficulty concentrating or making decisions? 0  Walking or climbing stairs? 1  Dressing or bathing? 0  Doing errands, shopping? 0  Preparing Food and eating ? N  Using the Toilet? N  In the past six months, have you accidently leaked urine? N  Do you have problems with loss of bowel control? N  Managing your Medications? N  Managing your Finances? N  Housekeeping or managing your Housekeeping? N    Patient Care Team: Babs Sciara, MD as PCP - General (Family Medicine)  Indicate any recent Medical Services you may have received from other than [facility] providers in the past year (date may be approximate).     Assessment:  This is a routine wellness examination for [facility].  Hearing/Vision screen Hearing Screening - Comments:: Denies hearing difficulties   Vision Screening - Comments:: No vision problems; will schedule routine eye exam soon     Goals Addressed             This Visit's Progress    Prevent falls         Depression Screen     02/18/2024    3:12 PM 11/13/2022    9:37 AM 10/05/2022    3:28 PM 12/20/2020    3:07 PM 08/02/2020    5:00 PM 08/02/2020    2:28 PM 10/06/2017    9:50 AM  PHQ 2/9 Scores  PHQ - 2 Score 2 2 1 1 1  0 5  PHQ- 9 Score 8 10  10 7  13     Fall Risk     02/18/2024    3:13 PM 11/13/2022    9:37 AM 10/05/2022    3:27 PM  Fall Risk   Falls in the past year? 1 0 0  Number falls in past yr: 0 0 0  Injury with Fall? 0 0 0  Risk for fall due to : History of fall(s);Impaired balance/gait;Impaired mobility  No Fall Risks  Follow up Education provided;Falls prevention discussed;Falls evaluation completed  Falls evaluation completed    MEDICARE RISK AT HOME:  Medicare Risk at  Home Any stairs in or around the home?: No If so, are there any without handrails?: No Home free of loose throw rugs in walkways, pet beds, electrical cords, etc?: Yes Adequate lighting in your home to reduce risk of falls?: Yes Life alert?: No Use of a cane, walker or w/c?: Yes Grab bars in the bathroom?: Yes Shower chair or bench in shower?: No Elevated toilet seat or a handicapped toilet?: No  TIMED UP AND GO:  Was the test performed?  No  Cognitive Function: 6CIT completed        02/18/2024    3:14 PM  6CIT Screen  What Year? 0 points  What month? 0 points  What time? 0 points  Count back from 20 0 points  Months in reverse 0 points  Repeat phrase 0 points  Total Score 0 points    Immunizations Immunization History  Administered Date(s) Administered   DT (Pediatric) 01/28/2009   Influenza, Quadrivalent, Recombinant, Inj, Pf 07/14/2022   Influenza,inj,Quad PF,6+ Mos 08/21/2016, 10/06/2017, 10/11/2018, 08/02/2020   Influenza-Unspecified 10/19/2013, 08/14/2019   Moderna Sars-Covid-2 Vaccination 02/21/2020, 03/20/2020   Pneumococcal-Unspecified 04/08/1999   Td 10/07/1999   Tdap 01/28/2009    Screening Tests Health Maintenance  Topic Date Due   DTaP/Tdap/Td (4 - Td or Tdap) 01/29/2019   COVID-19 Vaccine (3 - 2024-25 season) 07/11/2023   INFLUENZA VACCINE  06/09/2024   Medicare Annual Wellness (AWV)  02/17/2025   Fecal DNA (Cologuard)  11/07/2025   Cervical Cancer Screening (HPV/Pap Cotest)  11/14/2027   HIV Screening  Completed   HPV VACCINES  Aged Out   Meningococcal B Vaccine  Aged Out   Colonoscopy  Discontinued   Hepatitis C Screening  Discontinued    Health Maintenance  Health Maintenance Due  Topic Date Due   DTaP/Tdap/Td (4 - Td or Tdap) 01/29/2019   COVID-19 Vaccine (3 - 2024-25 season) 07/11/2023   Health Maintenance Items Addressed: Patient declines mammogram at this time   Additional Screening:  Vision Screening: Recommended annual  ophthalmology exams for early detection of glaucoma and other disorders of the eye.  Dental  Screening: Recommended annual dental exams for proper oral hygiene  Community Resource Referral / Chronic Care Management: CRR required this visit?  No   CCM required this visit?  No     Plan:     I have personally reviewed and noted the following in the patient's chart:   Medical and social history Use of alcohol, tobacco or illicit drugs  Current medications and supplements including opioid prescriptions. Patient is not currently taking opioid prescriptions. Functional ability and status Nutritional status Physical activity Advanced directives List of other physicians Hospitalizations, surgeries, and ER visits in previous 12 months Vitals Screenings to include cognitive, depression, and falls Referrals and appointments  In addition, I have reviewed and discussed with patient certain preventive protocols, quality metrics, and best practice recommendations. A written personalized care plan for preventive services as well as general preventive health recommendations were provided to patient.     Kandis Fantasia [place], [place]   1/61/0960   After Visit Summary: (MyChart) Due to this being a telephonic visit, the after visit summary with patients personalized plan was offered to patient via MyChart   Notes: Nothing significant to report at this time.

## 2024-02-28 DIAGNOSIS — D329 Benign neoplasm of meninges, unspecified: Secondary | ICD-10-CM | POA: Diagnosis not present

## 2024-03-15 DIAGNOSIS — D320 Benign neoplasm of cerebral meninges: Secondary | ICD-10-CM | POA: Diagnosis not present

## 2024-03-15 DIAGNOSIS — D329 Benign neoplasm of meninges, unspecified: Secondary | ICD-10-CM | POA: Diagnosis not present

## 2024-03-28 ENCOUNTER — Encounter: Payer: Self-pay | Admitting: Family Medicine

## 2024-03-28 DIAGNOSIS — D320 Benign neoplasm of cerebral meninges: Secondary | ICD-10-CM | POA: Insufficient documentation

## 2024-04-07 DIAGNOSIS — D320 Benign neoplasm of cerebral meninges: Secondary | ICD-10-CM | POA: Diagnosis not present

## 2024-04-07 DIAGNOSIS — Z510 Encounter for antineoplastic radiation therapy: Secondary | ICD-10-CM | POA: Diagnosis not present

## 2024-04-07 DIAGNOSIS — D420 Neoplasm of uncertain behavior of cerebral meninges: Secondary | ICD-10-CM | POA: Diagnosis not present

## 2024-05-10 DIAGNOSIS — G35 Multiple sclerosis: Secondary | ICD-10-CM | POA: Diagnosis not present

## 2024-08-22 ENCOUNTER — Ambulatory Visit: Payer: Self-pay

## 2024-08-22 NOTE — Telephone Encounter (Signed)
 FYI Only or Action Required?: Action required by provider: request for appointment.  Patient was last seen in primary care on 11/13/2022 by Mauro Elveria BROCKS, NP.  Called Nurse Triage reporting Back Pain.  Symptoms began several days ago.  Interventions attempted: Rest, hydration, or home remedies and Ice/heat application.  Symptoms are: gradually worsening.  Triage Disposition: See HCP Within 4 Hours (Or PCP Triage)  Patient/caregiver understands and will follow disposition?: No, refuses disposition Reason for Disposition  [1] SEVERE back pain (e.g., excruciating, unable to do any normal activities) AND [2] not improved 2 hours after pain medicine  Answer Assessment - Initial Assessment Questions 1. ONSET: When did the pain begin? (e.g., minutes, hours, days)     Ongoing, Patient has MS  2. LOCATION: Where does it hurt? (upper, mid or lower back)     Lower Back  3. SEVERITY: How bad is the pain?  (e.g., Scale 1-10; mild, moderate, or severe)     10  4. PATTERN: Is the pain constant? (e.g., yes, no; constant, intermittent)      Intermittent  5. RADIATION: Does the pain shoot into your legs or somewhere else?     Denies Radiation  6. CAUSE:  What do you think is causing the back pain?      Patient states she recently had a fall  7. BACK OVERUSE:  Any recent lifting of heavy objects, strenuous work or exercise?     No  8. MEDICINES: What have you taken so far for the pain? (e.g., nothing, acetaminophen , NSAIDS)     Nothing  9. NEUROLOGIC SYMPTOMS: Do you have any weakness, numbness, or problems with bowel/bladder control?     No  10. OTHER SYMPTOMS: Do you have any other symptoms? (e.g., fever, abdomen pain, burning with urination, blood in urine)       No  11. PREGNANCY: Is there any chance you are pregnant? When was your last menstrual period?       No and No  Protocols used: Back Pain-A-AH

## 2024-08-22 NOTE — Telephone Encounter (Signed)
 Message from [place] G sent at 08/22/2024 10:28 AM EDT  Reason for Triage: pt wants to see carolyn hoskins on Friday oct 17th for back problems if carolyn have any cancellations please call pt and schedule appt for Friday oct 17th

## 2024-08-22 NOTE — Telephone Encounter (Signed)
 Stacey Elveria BROCKS, NP     08/22/24 12:22 PM I am not back in the office until 10/23. She may need to be seen by another provider sooner.

## 2024-08-22 NOTE — Telephone Encounter (Signed)
 Left a message for the patient to return call to schedule an appt if needed.

## 2024-08-23 NOTE — Telephone Encounter (Signed)
 Patient scheduled appointment with carolyn NP 09/15/24

## 2024-08-25 ENCOUNTER — Ambulatory Visit: Payer: Self-pay

## 2024-08-25 NOTE — Telephone Encounter (Signed)
 FYI Only or Action Required?: FYI only for provider.  Patient was last seen in primary care on 11/13/2022 by Mauro Elveria BROCKS, NP.  Called Nurse Triage reporting Back Pain.  Symptoms began a week ago.  Interventions attempted: OTC medications: Tylenol , Rest, hydration, or home remedies, and Ice/heat application.  Symptoms are: gradually worsening.  Triage Disposition: See PCP When Office is Open (Within 3 Days)  Patient/caregiver understands and will follow disposition?: No, refuses dispositionCopied from CRM #[id number]. Topic: Clinical - Red Word Triage >> Aug 25, 2024  3:14 PM Joesph NOVAK wrote: Red Word that prompted transfer to Nurse Triage: Back pain Reason for Disposition  [1] MODERATE back pain (e.g., interferes with normal activities) AND [2] present > 3 days    Moderate to severe back pain for past several years, worse over past week.  Answer Assessment - Initial Assessment Questions Has MS. Fell 4-5 times over past year. Most recent fall 2 month ago. Uses a cane all the time, uses FWW for longer distances. Advised UC, pt declines saying she would would prefer to be seen in office next week. Earliest office appt made for Tuesday of next week and advised ED or UC for worsening symptoms.   1. ONSET: When did the pain begin? (e.g., minutes, hours, days)     Years ago. Worse over past week.  2. LOCATION: Where does it hurt? (upper, mid or lower back)     Back  3. SEVERITY: How bad is the pain?  (e.g., Scale 1-10; mild, moderate, or severe)     10/10, tylenol  improves pain. Baseline pain is 5-6/10 for past several years.  4. PATTERN: Is the pain constant? (e.g., yes, no; constant, intermittent)      Constant  5. RADIATION: Does the pain shoot into your legs or somewhere else?     Lower back  6. CAUSE:  What do you think is causing the back pain?      MS  7. BACK OVERUSE:  Any recent lifting of heavy objects, strenuous work or exercise?     No  8. MEDICINES:  What have you taken so far for the pain? (e.g., nothing, acetaminophen , NSAIDS)     Tylenol   9. NEUROLOGIC SYMPTOMS: Do you have any weakness, numbness, or problems with bowel/bladder control?     No  10. OTHER SYMPTOMS: Do you have any other symptoms? (e.g., fever, abdomen pain, burning with urination, blood in urine)       No  Protocols used: Back Pain-A-AH

## 2024-08-29 ENCOUNTER — Encounter: Payer: Self-pay | Admitting: Physician Assistant

## 2024-08-29 ENCOUNTER — Ambulatory Visit (INDEPENDENT_AMBULATORY_CARE_PROVIDER_SITE_OTHER): Admitting: Physician Assistant

## 2024-08-29 VITALS — BP 138/85 | HR 88 | Temp 97.9°F | Ht 65.0 in | Wt 165.5 lb

## 2024-08-29 DIAGNOSIS — Z23 Encounter for immunization: Secondary | ICD-10-CM | POA: Diagnosis not present

## 2024-08-29 DIAGNOSIS — M545 Low back pain, unspecified: Secondary | ICD-10-CM | POA: Diagnosis not present

## 2024-08-29 MED ORDER — PREDNISONE 20 MG PO TABS: ORAL_TABLET | ORAL | 0 refills | Status: AC

## 2024-08-29 MED ORDER — BACLOFEN 10 MG PO TABS: 10.0000 mg | ORAL_TABLET | Freq: Every day | ORAL | 1 refills | Status: DC

## 2024-08-29 MED ORDER — NAPROXEN 500 MG PO TABS: ORAL_TABLET | ORAL | 2 refills | Status: AC

## 2024-08-29 NOTE — Assessment & Plan Note (Addendum)
 Acute low back pain for two weeks, possibly exacerbated by recent falls. No neurological deficits on exam. Weakness due to MS, no worsening symptoms.  - Order lumbar spine x-ray. - Refill baclofen for bedtime muscle relaxation. - Prescribe naproxen  for pain. - Prescribe steroid taper for inflammation. - Follow up if symptoms do not improve or worsening, if she experiences fever, worsening weakness, changes in bowel or bladder habits, or change in baseline.

## 2024-08-29 NOTE — Progress Notes (Signed)
 Acute Office Visit  Subjective:     Patient ID: Stacey Kim, female    DOB: [birth year], 48 y.o.   MRN: [id number]   Discussed the use of AI scribe software for clinical note transcription with the patient, who gave verbal consent to proceed.  History of Present Illness Stacey Kim is a 48 year old female with multiple sclerosis who presents with acute low back pain.  She experiences severe, constant pain in the middle of her lower back for almost two weeks, unrelieved by movement or rest. Tylenol  provides minimal relief, and she has not taken baclofen due to running out. She fell about a month ago, with back pain starting two weeks post-fall. She has no numbness, tingling, or changes in bowel or urinary habits. She denies changes in baseline MS symptoms today.     Review of Systems  Constitutional:  Negative for fever and malaise/fatigue.  Musculoskeletal:  Positive for back pain and falls.  Neurological:  Negative for dizziness, sensory change, focal weakness and headaches.       Objective:     BP 138/85 (BP Location: Left Arm, Patient Position: Sitting)   Pulse 88   Temp 97.9 F (36.6 C)   Ht 5' 5 (1.651 m)   Wt 165 lb 8 oz (75.1 kg)   LMP 12/07/2016 (Approximate)   SpO2 100%   BMI 27.54 kg/m   Physical Exam Constitutional:      General: She is not in acute distress.    Appearance: Normal appearance. She is not ill-appearing.  HENT:     Head: Normocephalic and atraumatic.     Mouth/Throat:     Mouth: Mucous membranes are moist.     Pharynx: Oropharynx is clear.  Eyes:     Extraocular Movements: Extraocular movements intact.     Conjunctiva/sclera: Conjunctivae normal.  Cardiovascular:     Rate and Rhythm: Normal rate and regular rhythm.     Heart sounds: Normal heart sounds. No murmur heard. Pulmonary:     Effort: Pulmonary effort is normal.     Breath sounds: Normal breath sounds.  Musculoskeletal:     Lumbar back: Tenderness and bony tenderness  present. No swelling, deformity or spasms.     Right lower leg: No edema.     Left lower leg: No edema.  Skin:    General: Skin is warm and dry.  Neurological:     General: No focal deficit present.     Mental Status: She is alert and oriented to person, place, and time. Mental status is at baseline.     Sensory: No sensory deficit.     Motor: No weakness.  Psychiatric:        Mood and Affect: Mood normal.        Behavior: Behavior normal.     No results found for any visits on 08/29/24.      Assessment & Plan:  Immunization due -     Flu vaccine trivalent PF, 6mos and older(Flulaval,Afluria,Fluarix,Fluzone)  Acute midline low back pain without sciatica Assessment & Plan: Acute low back pain for two weeks, possibly exacerbated by recent falls. No neurological deficits on exam. Weakness due to MS, no worsening symptoms.  - Order lumbar spine x-ray. - Refill baclofen for bedtime muscle relaxation. - Prescribe naproxen  for pain. - Prescribe steroid taper for inflammation. - Follow up if symptoms do not improve or worsening, if she experiences fever, worsening weakness, changes in bowel or bladder habits, or change in baseline.  Orders: -     predniSONE; Take 2 tablets (40 mg total) by mouth daily for 4 days, THEN 1 tablet (20 mg total) daily for 4 days, THEN 0.5 tablets (10 mg total) daily for 4 days.  Dispense: 14 tablet; Refill: 0 -     Naproxen ; TAKE 1 TABLET TWICE A DAY WITH FOOD AS NEEDED FOR PAIN  Dispense: 60 tablet; Refill: 2 -     Baclofen; Take 1 tablet (10 mg total) by mouth at bedtime.  Dispense: 30 each; Refill: 1 -     DG Lumbar Spine Complete   Return in about 3 months (around 11/29/2024) for with Elveria for f/u.  Charmaine Loreley Schwall, PA-C

## 2024-09-15 ENCOUNTER — Ambulatory Visit: Payer: Self-pay | Admitting: Nurse Practitioner

## 2024-09-20 ENCOUNTER — Other Ambulatory Visit: Payer: Self-pay | Admitting: Physician Assistant

## 2024-09-20 DIAGNOSIS — M545 Low back pain, unspecified: Secondary | ICD-10-CM

## 2024-10-10 ENCOUNTER — Other Ambulatory Visit: Payer: Self-pay | Admitting: Physician Assistant

## 2024-10-10 DIAGNOSIS — M545 Low back pain, unspecified: Secondary | ICD-10-CM

## 2024-12-08 ENCOUNTER — Other Ambulatory Visit (HOSPITAL_COMMUNITY): Payer: Self-pay | Admitting: Nurse Practitioner

## 2024-12-08 DIAGNOSIS — G939 Disorder of brain, unspecified: Secondary | ICD-10-CM

## 2024-12-08 DIAGNOSIS — D329 Benign neoplasm of meninges, unspecified: Secondary | ICD-10-CM

## 2024-12-08 DIAGNOSIS — G35D Multiple sclerosis, unspecified: Secondary | ICD-10-CM

## 2024-12-22 ENCOUNTER — Ambulatory Visit (HOSPITAL_COMMUNITY)

## 2025-02-23 ENCOUNTER — Ambulatory Visit
